# Patient Record
Sex: Female | Born: 1975 | Race: White | Hispanic: No | Marital: Married | Smoking: Never smoker
Health system: Southern US, Community
[De-identification: ages and names within clinical notes are randomized; demographics above are authoritative.]

## PROBLEM LIST (undated history)

## (undated) DIAGNOSIS — R3129 Other microscopic hematuria: Secondary | ICD-10-CM

## (undated) DIAGNOSIS — N979 Female infertility, unspecified: Secondary | ICD-10-CM

## (undated) DIAGNOSIS — R5381 Other malaise: Secondary | ICD-10-CM

## (undated) DIAGNOSIS — E785 Hyperlipidemia, unspecified: Secondary | ICD-10-CM

## (undated) DIAGNOSIS — R5383 Other fatigue: Principal | ICD-10-CM

## (undated) DIAGNOSIS — R790 Abnormal level of blood mineral: Secondary | ICD-10-CM

## (undated) DIAGNOSIS — N200 Calculus of kidney: Secondary | ICD-10-CM

## (undated) DIAGNOSIS — J029 Acute pharyngitis, unspecified: Secondary | ICD-10-CM

## (undated) DIAGNOSIS — T7840XA Allergy, unspecified, initial encounter: Secondary | ICD-10-CM

## (undated) DIAGNOSIS — F419 Anxiety disorder, unspecified: Secondary | ICD-10-CM

## (undated) HISTORY — DX: Anxiety disorder, unspecified: F41.9

## (undated) HISTORY — DX: Hyperlipidemia, unspecified: E78.5

## (undated) HISTORY — DX: Other fatigue: R53.83

## (undated) HISTORY — DX: Calculus of kidney: N20.0

## (undated) HISTORY — DX: Female infertility, unspecified: N97.9

## (undated) HISTORY — DX: Other microscopic hematuria: R31.29

## (undated) HISTORY — DX: Abnormal level of blood mineral: R79.0

## (undated) HISTORY — DX: Other malaise: R53.81

## (undated) HISTORY — DX: Allergy, unspecified, initial encounter: T78.40XA

## (undated) HISTORY — DX: Acute pharyngitis, unspecified: J02.9

---

## 2003-06-16 ENCOUNTER — Emergency Department (HOSPITAL_COMMUNITY): Admission: AD | Admit: 2003-06-16 | Discharge: 2003-06-16 | Payer: Self-pay | Admitting: Family Medicine

## 2003-11-05 ENCOUNTER — Ambulatory Visit (HOSPITAL_COMMUNITY): Admission: RE | Admit: 2003-11-05 | Discharge: 2003-11-05 | Payer: Self-pay

## 2004-01-11 ENCOUNTER — Ambulatory Visit (HOSPITAL_COMMUNITY): Admission: RE | Admit: 2004-01-11 | Discharge: 2004-01-11 | Payer: Self-pay | Admitting: Obstetrics and Gynecology

## 2004-10-01 ENCOUNTER — Emergency Department (HOSPITAL_COMMUNITY): Admission: EM | Admit: 2004-10-01 | Discharge: 2004-10-01 | Payer: Self-pay | Admitting: Family Medicine

## 2004-12-20 ENCOUNTER — Other Ambulatory Visit: Admission: RE | Admit: 2004-12-20 | Discharge: 2004-12-20 | Payer: Self-pay | Admitting: Obstetrics and Gynecology

## 2006-03-20 ENCOUNTER — Other Ambulatory Visit: Admission: RE | Admit: 2006-03-20 | Discharge: 2006-03-20 | Payer: Self-pay | Admitting: Obstetrics and Gynecology

## 2006-11-05 ENCOUNTER — Emergency Department (HOSPITAL_COMMUNITY): Admission: EM | Admit: 2006-11-05 | Discharge: 2006-11-05 | Payer: Self-pay | Admitting: Emergency Medicine

## 2007-02-05 ENCOUNTER — Emergency Department (HOSPITAL_COMMUNITY): Admission: EM | Admit: 2007-02-05 | Discharge: 2007-02-05 | Payer: Self-pay | Admitting: Emergency Medicine

## 2008-03-13 ENCOUNTER — Ambulatory Visit: Payer: Self-pay | Admitting: Internal Medicine

## 2008-03-13 DIAGNOSIS — J309 Allergic rhinitis, unspecified: Secondary | ICD-10-CM | POA: Insufficient documentation

## 2008-03-26 ENCOUNTER — Ambulatory Visit: Payer: Self-pay | Admitting: Internal Medicine

## 2008-03-26 LAB — CONVERTED CEMR LAB
AST: 17 units/L (ref 0–37)
Albumin: 4 g/dL (ref 3.5–5.2)
Alkaline Phosphatase: 52 units/L (ref 39–117)
Bilirubin, Direct: 0.1 mg/dL (ref 0.0–0.3)
Cholesterol: 196 mg/dL (ref 0–200)
Creatinine, Ser: 0.5 mg/dL (ref 0.4–1.2)
GFR calc Af Amer: 182 mL/min
GFR calc non Af Amer: 150 mL/min
Glucose, Bld: 93 mg/dL (ref 70–99)
HCT: 40.6 % (ref 36.0–46.0)
HDL: 64.6 mg/dL (ref >39.0)
LDL Cholesterol: 112 mg/dL — ABNORMAL HIGH (ref 0–99)
MCV: 88.7 fL (ref 78.0–100.0)
Platelets: 279 10*3/uL (ref 150–400)
RDW: 11.5 % (ref 11.5–14.6)
Sodium: 139 meq/L (ref 135–145)
TSH: 1.29 microintl units/mL (ref 0.35–5.50)
Total Bilirubin: 0.8 mg/dL (ref 0.3–1.2)
Total CHOL/HDL Ratio: 3
Total Protein: 7.4 g/dL (ref 6.0–8.3)
Triglycerides: 98 mg/dL (ref 0–149)
VLDL: 20 mg/dL (ref 0–40)
WBC: 8.8 10*3/uL (ref 4.5–10.5)

## 2008-05-01 ENCOUNTER — Ambulatory Visit: Payer: Self-pay | Admitting: *Deleted

## 2008-05-01 DIAGNOSIS — J321 Chronic frontal sinusitis: Secondary | ICD-10-CM | POA: Insufficient documentation

## 2008-09-21 ENCOUNTER — Ambulatory Visit: Payer: Self-pay | Admitting: Internal Medicine

## 2008-09-21 DIAGNOSIS — J069 Acute upper respiratory infection, unspecified: Secondary | ICD-10-CM | POA: Insufficient documentation

## 2008-11-04 ENCOUNTER — Encounter: Payer: Self-pay | Admitting: Internal Medicine

## 2008-12-11 ENCOUNTER — Ambulatory Visit: Payer: Self-pay | Admitting: Family Medicine

## 2008-12-11 DIAGNOSIS — N10 Acute tubulo-interstitial nephritis: Secondary | ICD-10-CM | POA: Insufficient documentation

## 2008-12-11 LAB — CONVERTED CEMR LAB
Bilirubin Urine: NEGATIVE
Glucose, Urine, Semiquant: NEGATIVE
Protein, U semiquant: >=300
WBC Urine, dipstick: NEGATIVE

## 2008-12-12 ENCOUNTER — Encounter: Payer: Self-pay | Admitting: Family Medicine

## 2008-12-23 ENCOUNTER — Ambulatory Visit: Payer: Self-pay | Admitting: Internal Medicine

## 2008-12-23 ENCOUNTER — Ambulatory Visit (HOSPITAL_BASED_OUTPATIENT_CLINIC_OR_DEPARTMENT_OTHER): Admission: RE | Admit: 2008-12-23 | Discharge: 2008-12-23 | Payer: Self-pay | Admitting: Internal Medicine

## 2008-12-23 ENCOUNTER — Ambulatory Visit: Payer: Self-pay | Admitting: Diagnostic Radiology

## 2008-12-23 DIAGNOSIS — R3129 Other microscopic hematuria: Secondary | ICD-10-CM | POA: Insufficient documentation

## 2008-12-23 DIAGNOSIS — R300 Dysuria: Secondary | ICD-10-CM | POA: Insufficient documentation

## 2008-12-23 LAB — CONVERTED CEMR LAB
ALT: 11 units/L (ref 0–35)
AST: 18 units/L (ref 0–37)
Albumin: 4.3 g/dL (ref 3.5–5.2)
BUN: 14 mg/dL (ref 6–23)
Basophils Absolute: 0 10*3/uL (ref 0.0–0.1)
Basophils Relative: 0 % (ref 0–1)
Bilirubin Urine: NEGATIVE
Bilirubin Urine: NEGATIVE
Calcium: 9.3 mg/dL (ref 8.4–10.5)
Creatinine, Ser: 0.69 mg/dL (ref 0.40–1.20)
Eosinophils Absolute: 0.1 10*3/uL (ref 0.0–0.7)
Glucose, Urine, Semiquant: NEGATIVE
HCT: 40.9 % (ref 36.0–46.0)
Hemoglobin: 13.5 g/dL (ref 12.0–15.0)
Ketones, ur: NEGATIVE mg/dL
Leukocytes, UA: NEGATIVE
Monocytes Absolute: 0.6 10*3/uL (ref 0.1–1.0)
Monocytes Relative: 6 % (ref 3–12)
Platelets: 309 10*3/uL (ref 150–400)
Protein, U semiquant: 100
Protein, ur: NEGATIVE mg/dL
RBC: 4.6 M/uL (ref 3.87–5.11)
RDW: 12.4 % (ref 11.5–15.5)
Sed Rate: 10 mm/hr (ref 0–22)
Sodium: 138 meq/L (ref 135–145)
Specific Gravity, Urine: 1.018 (ref 1.005–1.030)
Urine Glucose: NEGATIVE mg/dL
WBC Urine, dipstick: NEGATIVE
WBC: 10.1 10*3/uL (ref 4.0–10.5)
pH: 8.5

## 2008-12-24 ENCOUNTER — Encounter: Payer: Self-pay | Admitting: Internal Medicine

## 2008-12-24 ENCOUNTER — Telehealth: Payer: Self-pay | Admitting: Internal Medicine

## 2009-01-06 ENCOUNTER — Ambulatory Visit: Payer: Self-pay | Admitting: Internal Medicine

## 2009-01-06 LAB — CONVERTED CEMR LAB
Bilirubin Urine: NEGATIVE
Ketones, urine, test strip: NEGATIVE
Nitrite: NEGATIVE
Protein, ur: NEGATIVE mg/dL
Urobilinogen, UA: 0.2
WBC Urine, dipstick: NEGATIVE
WBC, UA: NONE SEEN cells/hpf (ref <3)

## 2009-01-11 ENCOUNTER — Encounter: Payer: Self-pay | Admitting: Internal Medicine

## 2009-01-19 ENCOUNTER — Telehealth: Payer: Self-pay | Admitting: Internal Medicine

## 2009-01-26 ENCOUNTER — Encounter: Payer: Self-pay | Admitting: Internal Medicine

## 2009-02-17 ENCOUNTER — Encounter: Payer: Self-pay | Admitting: Internal Medicine

## 2009-02-24 ENCOUNTER — Ambulatory Visit: Payer: Self-pay | Admitting: Internal Medicine

## 2009-02-24 DIAGNOSIS — K589 Irritable bowel syndrome without diarrhea: Secondary | ICD-10-CM | POA: Insufficient documentation

## 2009-02-24 DIAGNOSIS — Z87442 Personal history of urinary calculi: Secondary | ICD-10-CM | POA: Insufficient documentation

## 2009-03-30 ENCOUNTER — Encounter: Payer: Self-pay | Admitting: Internal Medicine

## 2009-04-13 ENCOUNTER — Ambulatory Visit: Payer: Self-pay | Admitting: Internal Medicine

## 2009-04-13 DIAGNOSIS — R1011 Right upper quadrant pain: Secondary | ICD-10-CM | POA: Insufficient documentation

## 2009-04-13 LAB — CONVERTED CEMR LAB
ALT: 10 units/L (ref 0–35)
Basophils Absolute: 0 10*3/uL (ref 0.0–0.1)
Bilirubin, Direct: 0.1 mg/dL (ref 0.0–0.3)
Eosinophils Absolute: 0.2 10*3/uL (ref 0.0–0.7)
Eosinophils Relative: 2 % (ref 0–5)
Glucose, Urine, Semiquant: NEGATIVE
Hemoglobin: 13.3 g/dL (ref 12.0–15.0)
Ketones, urine, test strip: NEGATIVE
Lymphocytes Relative: 27 % (ref 12–46)
Monocytes Relative: 7 % (ref 3–12)
Neutro Abs: 7.1 10*3/uL (ref 1.7–7.7)
Neutrophils Relative %: 64 % (ref 43–77)
Platelets: 335 10*3/uL (ref 150–400)
Total Protein: 7.7 g/dL (ref 6.0–8.3)
Urobilinogen, UA: 0.2
WBC Urine, dipstick: NEGATIVE
pH: 5

## 2009-04-14 ENCOUNTER — Telehealth: Payer: Self-pay | Admitting: Internal Medicine

## 2009-04-21 ENCOUNTER — Ambulatory Visit (HOSPITAL_BASED_OUTPATIENT_CLINIC_OR_DEPARTMENT_OTHER): Admission: RE | Admit: 2009-04-21 | Discharge: 2009-04-21 | Payer: Self-pay | Admitting: Internal Medicine

## 2009-04-21 ENCOUNTER — Ambulatory Visit: Payer: Self-pay | Admitting: Diagnostic Radiology

## 2009-04-23 ENCOUNTER — Telehealth: Payer: Self-pay | Admitting: Internal Medicine

## 2009-05-27 ENCOUNTER — Encounter: Payer: Self-pay | Admitting: Internal Medicine

## 2009-06-10 ENCOUNTER — Encounter: Admission: RE | Admit: 2009-06-10 | Discharge: 2009-06-10 | Payer: Self-pay | Admitting: Obstetrics and Gynecology

## 2009-08-21 ENCOUNTER — Encounter: Payer: Self-pay | Admitting: Internal Medicine

## 2009-09-03 ENCOUNTER — Ambulatory Visit: Payer: Self-pay | Admitting: Family

## 2009-09-03 DIAGNOSIS — R070 Pain in throat: Secondary | ICD-10-CM | POA: Insufficient documentation

## 2009-09-07 ENCOUNTER — Encounter (INDEPENDENT_AMBULATORY_CARE_PROVIDER_SITE_OTHER): Payer: Self-pay | Admitting: *Deleted

## 2009-09-09 ENCOUNTER — Encounter: Payer: Self-pay | Admitting: Internal Medicine

## 2009-11-08 ENCOUNTER — Ambulatory Visit: Payer: Self-pay | Admitting: Internal Medicine

## 2009-11-08 DIAGNOSIS — R05 Cough: Secondary | ICD-10-CM

## 2009-11-08 DIAGNOSIS — K219 Gastro-esophageal reflux disease without esophagitis: Secondary | ICD-10-CM | POA: Insufficient documentation

## 2009-11-08 DIAGNOSIS — R059 Cough, unspecified: Secondary | ICD-10-CM | POA: Insufficient documentation

## 2010-02-04 ENCOUNTER — Ambulatory Visit: Payer: Self-pay | Admitting: Internal Medicine

## 2010-02-04 DIAGNOSIS — F410 Panic disorder [episodic paroxysmal anxiety] without agoraphobia: Secondary | ICD-10-CM | POA: Insufficient documentation

## 2010-02-04 DIAGNOSIS — R002 Palpitations: Secondary | ICD-10-CM | POA: Insufficient documentation

## 2010-02-04 LAB — CONVERTED CEMR LAB
Free T4: 1.09 ng/dL (ref 0.80–1.80)
TSH: 1.968 microintl units/mL (ref 0.350–4.500)

## 2010-02-07 ENCOUNTER — Encounter: Payer: Self-pay | Admitting: Internal Medicine

## 2010-02-07 LAB — CONVERTED CEMR LAB
5-HIAA, 24 Hr Urine: 3.3 mg/(24.h) (ref <=6.0)
Normetanephrine, 24H Ur: 409 (ref 35–482)

## 2010-02-14 ENCOUNTER — Telehealth: Payer: Self-pay | Admitting: Internal Medicine

## 2010-03-03 ENCOUNTER — Encounter: Payer: Self-pay | Admitting: Internal Medicine

## 2010-06-01 ENCOUNTER — Ambulatory Visit
Admission: RE | Admit: 2010-06-01 | Discharge: 2010-06-01 | Payer: Self-pay | Source: Home / Self Care | Attending: Family | Admitting: Family

## 2010-06-01 ENCOUNTER — Encounter: Payer: Self-pay | Admitting: Family

## 2010-06-01 DIAGNOSIS — K5289 Other specified noninfective gastroenteritis and colitis: Secondary | ICD-10-CM | POA: Insufficient documentation

## 2010-06-01 LAB — CONVERTED CEMR LAB
Alkaline Phosphatase: 55 units/L (ref 39–117)
Bilirubin, Direct: 0.1 mg/dL (ref 0.0–0.3)
Calcium: 9.8 mg/dL (ref 8.4–10.5)
Creatinine, Ser: 0.73 mg/dL (ref 0.40–1.20)
Eosinophils Absolute: 0.1 10*3/uL (ref 0.0–0.7)
Eosinophils Relative: 1 % (ref 0–5)
HCT: 46.9 % — ABNORMAL HIGH (ref 36.0–46.0)
Hemoglobin: 15.4 g/dL — ABNORMAL HIGH (ref 12.0–15.0)
Indirect Bilirubin: 0.2 mg/dL (ref 0.0–0.9)
Monocytes Absolute: 0.7 10*3/uL (ref 0.1–1.0)
Monocytes Relative: 12 % (ref 3–12)
Neutrophils Relative %: 64 % (ref 43–77)
Potassium: 4.6 meq/L (ref 3.5–5.3)
Sodium: 141 meq/L (ref 135–145)
WBC: 6 10*3/uL (ref 4.0–10.5)

## 2010-06-02 ENCOUNTER — Telehealth: Payer: Self-pay | Admitting: Family

## 2010-06-16 ENCOUNTER — Encounter: Payer: Self-pay | Admitting: Internal Medicine

## 2010-06-19 ENCOUNTER — Encounter: Payer: Self-pay | Admitting: Obstetrics and Gynecology

## 2010-06-19 ENCOUNTER — Encounter: Payer: Self-pay | Admitting: Internal Medicine

## 2010-06-28 NOTE — Letter (Signed)
[facility]   Imported By: Lanelle Bal 09/03/2009 09:37:46  _____________________________________________________________________  External Attachment:    Type:   Image     Comment:   External Document

## 2010-06-28 NOTE — Letter (Signed)
Summary: [facility]  [facility]   Imported By: Lanelle Bal 06/14/2009 12:48:32  _____________________________________________________________________  External Attachment:    Type:   Image     Comment:   External Document

## 2010-06-28 NOTE — Assessment & Plan Note (Signed)
Summary: follow up on axiety attacks from past-lb   Vital Signs:  Patient profile:   35 year old female Height:      64 inches Weight:      188.25 pounds BMI:     32.43 Temp:     98.7 degrees F oral Pulse rhythm:   regular Resp:     18 per minute BP sitting:   100 / 80  (right arm) Cuff size:   large  Vitals Entered By: Glendell Docker CMA (February 04, 2010 3:47 PM) CC: Anxiety Is Patient Diabetic? No Pain Assessment Patient in pain? no      Comments discuss anxiety/ panic attacks   Primary Care Provider:  Dondra Spry DO  CC:  Anxiety.  History of Present Illness: 35 y/o white female reports hx of presumed anxiety attacks.  symptoms started 10 -12 yrs ago  last took zoloft 5 yrs ago with seem to help no improvement with exercise  she feels like her throat is closing feels like she is not of control  no symptoms when she busy  occurs when she is quiet  pounding sensation around neck - carotid arteries  recent flare:  no specific trigger.  does not feel stressed   Preventive Screening-Counseling & Management  Alcohol-Tobacco     Smoking Status: never  Allergies (verified): No Known Drug Allergies  Past History:  Past Medical History: Allergic rhinitis  Infertility      Nephrolithiasis, hx of   Microscopic hematuria - negative urologic w/u ([facility] - 2010)   CT of Abd and pelvis - negative for malignancy Hx of Anxiety    Past Surgical History: Denies surgical history          Family History: Htn - Father, mother Hyperlipidemia - father, mother Liver cancer - MGM Bone cancer - PGM Breast cancer - no Colon cancer - no Diabetes - PGF   No fam hx of lupus or other connective tissue disease Father has hx of recurrent kidney stones    Grandfather who lives in [place] is having 100th B Day    Social History: Married for 5 yrs (Husband [place]) Never Smoked Alcohol use-no   Occupation: [employer], 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   Mouth:  pharynx pink and moist and no pharyngeal crowing.   Neck:  No deformities, masses, or tenderness noted. Lungs:  normal respiratory effort and normal breath sounds.   Heart:  normal rate, regular rhythm, and no gallop.     Impression & Recommendations:  Problem # 1:  PALPITATIONS, RECURRENT (ICD-785.1) question anxiety d/o vs other.  rule out pheo, hyperthyoidism, and carcinoid we reviewed deep breathing exercises throat sensation may be related to allergic rxn.  consider allergy referral if recurrent symptoms Orders: T-TSH ([id number]) T-T4, Free [phone]) T- * Misc. Laboratory test [phone]) T- * Misc. Laboratory test [phone]) T- * Misc. Laboratory test [phone])  Complete Medication List: 1)  Omeprazole 20 Mg Cpdr (Omeprazole) .... One by mouth once daily 15 mins before am meal  Other Orders: Influenza Vaccine NON MCR (25956) Flu Vaccine 57yrs + MEDICARE PATIENTS (L8756)  Patient Instructions: 1)  Please schedule a follow-up appointment in 2 weeks.  Current Allergies (reviewed today): No known allergies   Immunizations Administered:  Influenza Vaccine # 1:    Vaccine Type: Fluvax Non-MCR    Site: left deltoid    Mfr: GlaxoSmithKline  Dose: 0.5 ml    Route: IM    Given by: Glendell Docker CMA    Exp. Date: 11/26/2010    Lot #: [id number]    VIS given: 12/21/09 version given February 04, 2010.  Flu Vaccine Consent Questions:    Do you have a history of severe allergic reactions to this vaccine? no    Any prior history of allergic reactions to egg and/or gelatin? no    Do you have a sensitivity to the preservative Thimersol? no    Do you have a past history of Guillan-Barre Syndrome? no    Do you currently have an acute febrile illness? no    Have you ever had a severe reaction to latex? no    Vaccine information given and explained to patient? yes    Are you currently  pregnant? no

## 2010-06-28 NOTE — Assessment & Plan Note (Signed)
Summary: FEELS LIKE SOMETHING STUCK IN THROAT   Vital Signs:  Patient profile:   35 year old female Height:      64 inches Weight:      190.75 pounds BMI:     32.86 Temp:     98.5 degrees F oral Pulse rate:   84 / minute Pulse rhythm:   regular Resp:     16 per minute BP sitting:   126 / 82  (right arm) Cuff size:   large  Vitals Entered By: Mervin Kung CMA (September 03, 2009 3:14 PM) CC: room 5    Pt states she feels like there is something in her throat on the left side x 2 weeks. Has a pinching sensation in her throat when she turns her head.  Completed antibiotic 1 week ago for sinus infection.   Primary Care Provider:  Dondra Spry DO  CC:  room 5    Pt states she feels like there is something in her throat on the left side x 2 weeks. Has a pinching sensation in her throat when she turns her head.  Completed antibiotic 1 week ago for sinus infection.Marland Kitchen  History of Present Illness: Ms Cegielski is a 35 year old female who presents with complaint of sensation of throat discomfort which is noted in the back of her throat x 2 weeks. Thes followed a recent sinus infection which was [facility].  Notes that her sinus symptoms have improved.  Denies associated fever, denies cough.  Denies pain with swallowing.   Allergies (verified): No Known Drug Allergies  Physical Exam  General:  Well-developed,well-nourished,in no acute distress; alert,appropriate and cooperative throughout examination Head:  Normocephalic and atraumatic without obvious abnormalities. No apparent alopecia or balding. Ears:  External ear exam shows no significant lesions or deformities.  Otoscopic examination reveals clear canals, tympanic membranes are intact bilaterally without bulging, retraction, inflammation or discharge. Hearing is grossly normal bilaterally. Mouth:  Oral mucosa and oropharynx without lesions or exudates.  Teeth in good repair. Neck:  No deformities, masses, or tenderness  noted.   Impression & Recommendations:  Problem # 1:  THROAT PAIN (ICD-784.1) Assessment New 2 week history of left sided throat fullness, no palpable thyroid enlargement or lymphadenopthy.  Will refer to ENT for further evaluation.   Orders: ENT Referral (ENT)  Complete Medication List: 1)  Fluticasone Propionate 50 Mcg/act Susp (Fluticasone propionate) .... 2 sprays each nostril qd 2)  Sm Sinus & Allergy Max St 4-60 Mg Tabs (Chlorpheniramine-pseudoeph) .Marland Kitchen.. 1 tablet every four hours as needed.  Patient Instructions: 1)  You will be contacted about your referral to ENT.  Current Allergies (reviewed today): No known allergies

## 2010-06-28 NOTE — Letter (Signed)
Summary: Primary Care Consult Scheduled Letter  [place] [facility]  [address]   [place], [place] [address]   Phone: [phone]  Fax: [phone]      09/07/2009 MRN: [id number]  TIMERA WINDT [address] [place], [place]  [address]    Dear Ms. Fenton Malling,      We have scheduled an appointment for you.  At the recommendation of MELISSA O'SULLIVAN, FNP , we have scheduled you a consult with [facility], DR Jearld Fenton on APRIL 25TH   at 3:10PM .  Their address is_[address], [place] . The office phone number is [phone] .  If this appointment day and time is not convenient for you, please feel free to call the office of the doctor you are being referred to at the number listed above and reschedule the appointment.     It is important for you to keep your scheduled appointments. We are here to make sure you are given good patient care.     Thank you, Darral Dash Patient Care Coordinator [place] [facility]

## 2010-06-28 NOTE — Progress Notes (Signed)
  Phone Note Outgoing Call   Summary of Call: LMOVM - pt advised to call office re:  lab / urine test results Initial call taken by: D. Thomos Lemons DO,  February 14, 2010 12:05 PM  Follow-up for Phone Call        Pt called back, she will be at her home # the rest of the afternoon Diane Tomerlin  February 14, 2010 3:56 PM  Additional Follow-up for Phone Call Additional follow up Details #1::        pt notified of test results pt states panic attacks less freq she does not want to start any meds that are potentially unsafe during preg pt will think about behavioral health consult pt advised to avoid all caffeinated beverages and use deep breathing exercises  Additional Follow-up by: D. Thomos Lemons DO,  February 14, 2010 5:57 PM

## 2010-06-28 NOTE — Consult Note (Signed)
Summary: [facility]  [facility]   Imported By: Lanelle Bal 03/10/2010 12:07:22  _____________________________________________________________________  External Attachment:    Type:   Image     Comment:   External Document

## 2010-06-28 NOTE — Assessment & Plan Note (Signed)
Summary: CHEST CONGESTION BODY ACHES/DT--Rm 2   Vital Signs:  Patient profile:   35 year old female Height:      64 inches Weight:      188.75 pounds BMI:     32.52 O2 Sat:      100 % on Room air Temp:     98.4 degrees F oral Pulse rate:   90 / minute Pulse rhythm:   regular Resp:     16 per minute BP sitting:   110 / 80  (right arm) Cuff size:   large  Vitals Entered By: Mervin Kung CMA (November 08, 2009 4:27 PM)  O2 Flow:  Room air CC: , , Cough Is Patient Diabetic? No Comments Pt has completed Sinus and Allergy tabs.   Primary Care Provider:  Dondra Spry DO  CC:  , , and Cough.  History of Present Illness:  Cough      This is a 35 year old woman who presents with Cough.  The patient reports non-productive cough, but denies shortness of breath, wheezing, and fever.  Associated symtpoms include acid reflux symptoms.  The patient denies the following symptoms: cold/URI symptoms, sore throat, and nasal congestion.  The cough is worse with lying down.    Allergies (verified): No Known Drug Allergies  Past History:  Past Medical History: Allergic rhinitis  Infertility      Nephrolithiasis, hx of  Microscopic hematuria - negative urologic w/u ([facility] - 2010)   CT of Abd and pelvis - negative for malignancy   PMH-FH-SH reviewed for relevance  Family History: Htn - Father, mother Hyperlipidemia - father, mother Liver cancer - MGM Bone cancer - PGM Breast cancer - no Colon cancer - no Diabetes - PGF   No fam hx of lupus or other connective tissue disease Father has hx of recurrent kidney stones    Grandfather who lives in [place] is having 100th B Day  Social History: Married for 5 yrs (Husband [place]) Never Smoked Alcohol use-no  Occupation: [employer], 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   Eyes:  pupils equal, pupils round, and pupils reactive to light.   Ears:  R ear  normal and L ear normal.   Mouth:  pharynx pink and moist.   Lungs:  normal respiratory effort and normal breath sounds.   Heart:  normal rate, regular rhythm, and no gallop.     Impression & Recommendations:  Problem # 1:  COUGH (ICD-786.2) Pt with lower throat irritation triggering cough.  her symptoms got worse after stopping PPI.  ENT started PPI for abnormal throat sensation cough likely related to reflux vs atypical bronchitis restart PPI.  use cough suppressant We discussed reg flag symptoms.  She is traveling to [place].   Rx for azithro provided in case symptoms get worse.  Problem # 2:  GERD (ICD-530.81) she is already following anti reflux measures. Use PPI as directed.  If she wants to stop, we discussed transitioning to zantac  Her updated medication list for this problem includes:    Omeprazole 20 Mg Cpdr (Omeprazole) ..... One by mouth once daily 15 mins before am meal  Complete Medication List: 1)  Fluticasone Propionate 50 Mcg/act Susp (Fluticasone propionate) .... 2 sprays each nostril qd 2)  Sm Sinus & Allergy Max St 4-60 Mg Tabs (Chlorpheniramine-pseudoeph) .Marland Kitchen.. 1 tablet every four hours as needed. 3)  Hydrocodone-homatropine 5-1.5 Mg/68ml Syrp (Hydrocodone-homatropine) .... 5  ml by mouth two times a day as needed 4)  Azithromycin 250 Mg Tabs (Azithromycin) .... 2 tabs on day one, then one by mouth once daily 5)  Omeprazole 20 Mg Cpdr (Omeprazole) .... One by mouth once daily 15 mins before am meal  Patient Instructions: 1)  Call our office if your symptoms do not  improve or gets worse. Prescriptions: OMEPRAZOLE 20 MG CPDR (OMEPRAZOLE) one by mouth once daily 15 mins before AM meal  #90 x 1   Entered and Authorized by:   D. Thomos Lemons DO   Signed by:   D. Thomos Lemons DO on 11/08/2009   Method used:   [facility]        [facility] [phone][facility] (retail)       [address]       [place], [place]  [address]       Ph: [phone]        Fax: [phone]   RxID:   [id number] AZITHROMYCIN 250 MG TABS (AZITHROMYCIN) 2 tabs on day one, then one by mouth once daily  #6 x 0   Entered and Authorized by:   D. Thomos Lemons DO   Signed by:   D. Thomos Lemons DO on 11/08/2009   Method used:   Print then Give to Patient   RxID:   [id number] HYDROCODONE-HOMATROPINE 5-1.5 MG/5ML SYRP (HYDROCODONE-HOMATROPINE) 5 ml by mouth two times a day as needed  #60 x 0   Entered and Authorized by:   D. Thomos Lemons DO   Signed by:   D. Thomos Lemons DO on 11/08/2009   Method used:   Print then Give to Patient   RxID:   [id number]   Current Allergies (reviewed today): No known allergies

## 2010-06-28 NOTE — Consult Note (Signed)
Summary: [facility]  [facility]   Imported By: Lanelle Bal 09/16/2009 09:29:03  _____________________________________________________________________  External Attachment:    Type:   Image     Comment:   External Document

## 2010-06-30 NOTE — Letter (Signed)
Summary: Out of Work  Adult nurse at [employer]. [address]   [place], [place] [address]   Phone: [phone]  Fax: [phone]    June 01, 2010   Employee:  MEAGHAN WHISTLER    To Whom It May Concern:   For Medical reasons, please excuse the above named employee from work for the following dates:  Start:   05/31/10  End:   May return to work on 06/06/10  If you need additional information, please feel free to contact our office.         Sincerely,    Lemont Fillers FNP

## 2010-06-30 NOTE — Assessment & Plan Note (Signed)
Summary: fever vomiting/mhf--rm 5   Vital Signs:  Patient profile:   35 year old female Height:      64 inches Weight:      177 pounds BMI:     30.49 Temp:     97.8 degrees F oral Pulse rate:   78 / minute Pulse rhythm:   regular Resp:     16 per minute BP sitting:   122 / 86  (right arm) Cuff size:   large  Vitals Entered By: Stephanie Stout) (June 01, 2010 10:31 AM) CC: Pt states she has had diarrhea, vomiting and fever since Sunday. Feels like her pulse is racing., Headache Is Patient Diabetic? No Pain Assessment Patient in pain? no      Comments Pt agrees all med doses and directions are correct. Stephanie Stout)  June 01, 2010 10:35 AM    Primary Care Provider:  Dondra Spry DO  CC:  Pt states she has had diarrhea, vomiting and fever since Sunday. Feels like her pulse is racing., and Headache.  History of Present Illness: Stephanie Stout is a 35 year old female who presents today with chief complaint of diarrhea.  1) Diarrhea-  Symptoms started sunday night with vomitting.  Fever rose to 101 that day.  Diarrhea started Sunday night as well.  Denies sick contacts.  Feels weak and tired.  Feels hot and sweaty.  Trying to drink gatorade and water.  But if drinks too much vomits. Ate some cream of wheat this AM which she has kept down.  Denies stomach pain/cramping.  Now having diarrhea "every 30 minutes- just like water." This is not consistent with her typical symptoms of IBS.     Allergies (verified): 1)  ! Levaquin  Past History:  Past Medical History: Last updated: 02/04/2010 Allergic rhinitis  Infertility      Nephrolithiasis, hx of   Microscopic hematuria - negative urologic w/u ([facility] - 2010)   CT of Abd and pelvis - negative for malignancy Hx of Anxiety    Past Surgical History: Last updated: 02/04/2010 Denies surgical history          Review of Systems       see HPI  Physical Exam  General:   Well-developed,well-nourished,in no acute distress; alert,appropriate and cooperative throughout examination Head:  Normocephalic and atraumatic without obvious abnormalities. No apparent alopecia or balding. Lungs:  Normal respiratory effort, chest expands symmetrically. Lungs are clear to auscultation, no crackles or wheezes. Heart:  Normal rate and regular rhythm. S1 and S2 normal without gallop, murmur, click, rub or other extra sounds. Abdomen:  Bowel sounds positive,abdomen soft and non-tender without masses, organomegaly or hernias noted.   Impression & Recommendations:  Problem # 1:  GASTROENTERITIS, ACUTE (ICD-558.9) Assessment New Will plan to treat with zofran and lomotil.  Recommended continued hydration efforts- gatorade, popsicles, sorbet etc.  Pt instructed to call for f/u if symptoms worsen, or if no improvement in 48-72 hours.  Check labs as below. Her updated medication list for this problem includes:    Zofran 4 Mg Tabs (Ondansetron hcl) ..... One tablet by mouth every 8 hours as needed for nausea  Orders: TLB-CBC Platelet - w/Differential (85025-CBCD) TLB-Hepatic/Liver Function Pnl (80076-HEPATIC) TLB-BMP (Basic Metabolic Panel-BMET) (80048-METABOL)  Complete Medication List: 1)  Omeprazole 20 Mg Cpdr (Omeprazole) .... One by mouth once daily 15 mins before am meal 2)  Zofran 4 Mg Tabs (Ondansetron hcl) .... One tablet by mouth every  8 hours as needed for nausea 3)  Lomotil 2.5-0.025 Mg Tabs (Diphenoxylate-atropine) .... One tablet by mouth 4 times daily as needed for diarrrhea  Patient Instructions: 1)  Please drink small frequent amounts of fluid. 2)  Call if you develop fever, abdominal pain, if symptoms worsen, or if they are not improved in 48-72 hours. Prescriptions: LOMOTIL 2.5-0.025 MG TABS (DIPHENOXYLATE-ATROPINE) one tablet by mouth 4 times daily as needed for diarrrhea  #20 x 0   Entered and Authorized by:   Lemont Fillers FNP   Signed by:   Lemont Fillers FNP on 06/01/2010   Method used:   Print then Give to Patient   RxID:   [id number] ZOFRAN 4 MG TABS (ONDANSETRON HCL) one tablet by mouth every 8 hours as needed for nausea  #20 x 0   Entered and Authorized by:   Lemont Fillers FNP   Signed by:   Lemont Fillers FNP on 06/01/2010   Method used:   [facility] to        [facility] [phone]* (retail)       [address]       [place], [place]  [address]       Ph: [phone]       Fax: [phone]   RxID:   [id number]    Orders Added: 1)  TLB-CBC Platelet - w/Differential [85025-CBCD] 2)  TLB-Hepatic/Liver Function Pnl [80076-HEPATIC] 3)  TLB-BMP (Basic Metabolic Panel-BMET) [80048-METABOL] 4)  Est. Patient Level III [34742]    Current Allergies (reviewed today): ! LEVAQUIN

## 2010-06-30 NOTE — Progress Notes (Signed)
Summary: lab result, status  Phone Note Outgoing Call   Summary of Call: Please call patient and let her know that her labs look good.  Please ask her how she is feeling. Initial call taken by: Lemont Fillers FNP,  June 02, 2010 8:33 PM  Follow-up for Phone Call        Pt notified. States diarrhea stopped on Wednesday and nausea has subsided; feeling much better. Nicki Guadalajara Fergerson CMA Duncan Dull)  June 03, 2010 8:43 AM

## 2010-07-14 NOTE — Consult Note (Signed)
Summary: [facility]  [facility]   Imported By: Lanelle Bal 07/06/2010 08:00:50  _____________________________________________________________________  External Attachment:    Type:   Image     Comment:   External Document

## 2010-07-29 ENCOUNTER — Encounter: Payer: Self-pay | Admitting: Internal Medicine

## 2010-08-16 NOTE — Letter (Signed)
Summary: Physician's Report on Prospective Adoptive Parent  Physician's Report on Prospective Adoptive Parent   Imported By: Maryln Gottron 08/08/2010 10:55:38  _____________________________________________________________________  External Attachment:    Type:   Image     Comment:   External Document

## 2010-08-29 ENCOUNTER — Encounter: Payer: Self-pay | Admitting: Internal Medicine

## 2010-12-13 ENCOUNTER — Other Ambulatory Visit: Payer: Self-pay | Admitting: Gastroenterology

## 2010-12-13 DIAGNOSIS — R1032 Left lower quadrant pain: Secondary | ICD-10-CM

## 2010-12-15 ENCOUNTER — Ambulatory Visit
Admission: RE | Admit: 2010-12-15 | Discharge: 2010-12-15 | Disposition: A | Discharge status: Home / Self Care | Payer: BC Managed Care – PPO | Source: Ambulatory Visit | Attending: Gastroenterology | Admitting: Gastroenterology

## 2010-12-15 DIAGNOSIS — R1032 Left lower quadrant pain: Secondary | ICD-10-CM

## 2010-12-15 MED ORDER — IOHEXOL 300 MG/ML  SOLN: 100.0000 mL | Freq: Once | INTRAMUSCULAR | Status: AC | PRN

## 2011-02-16 ENCOUNTER — Ambulatory Visit (INDEPENDENT_AMBULATORY_CARE_PROVIDER_SITE_OTHER): Payer: BC Managed Care – PPO | Admitting: Internal Medicine

## 2011-02-16 ENCOUNTER — Encounter: Payer: Self-pay | Admitting: Internal Medicine

## 2011-02-16 VITALS — BP 122/84 | HR 80 | Temp 98.6°F | Ht 64.0 in | Wt 184.0 lb

## 2011-02-16 DIAGNOSIS — Z23 Encounter for immunization: Secondary | ICD-10-CM

## 2011-02-16 DIAGNOSIS — F410 Panic disorder [episodic paroxysmal anxiety] without agoraphobia: Secondary | ICD-10-CM

## 2011-02-16 DIAGNOSIS — K219 Gastro-esophageal reflux disease without esophagitis: Secondary | ICD-10-CM

## 2011-02-16 MED ORDER — ALPRAZOLAM 0.25 MG PO TABS: 0.2500 mg | ORAL_TABLET | Freq: Three times a day (TID) | ORAL | Status: DC | PRN

## 2011-02-16 MED ORDER — OMEPRAZOLE 40 MG PO CPDR: 40.0000 mg | DELAYED_RELEASE_CAPSULE | Freq: Every day | ORAL | Status: DC

## 2011-02-16 MED ORDER — ALPRAZOLAM 0.25 MG PO TABS: 0.2500 mg | ORAL_TABLET | Freq: Three times a day (TID) | ORAL | Status: AC | PRN

## 2011-02-16 MED ORDER — SERTRALINE HCL 25 MG PO TABS: ORAL_TABLET | ORAL | Status: DC

## 2011-02-16 NOTE — Patient Instructions (Signed)
Call our office in 1 month with response to sertraline.

## 2011-02-16 NOTE — Progress Notes (Signed)
  Subjective:    Patient ID: Stephanie Stout, female    DOB: [birth year], 35 y.o.   MRN: [id number]  HPI  35 y/o white female with hx anxiety for follow up.  She has tried relaxation techniques w/o improvement.  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  The stress is aggravating her GERD.  She gets nauseated when she is stressed.   She also feels heart thumping in her neck.  She has tried zoloft in the past.  She took for 4 yrs then stopped.  She does not recall any adverse rxn.   Review of Systems Mild wt loss.  No dysphagia   Past Medical History  Diagnosis Date  . Allergy   . Infertility, female   . Nephrolithiasis   . Hematuria, microscopic     negative urologic w/u, CT of abd and pelvis was negative for malignancy  . Anxiety     History   Social History  . Marital Status: Married    Spouse Name: N/A    Number of Children: N/A  . Years of Education: N/A   Occupational History  . Not on file.   Social History Main Topics  . Smoking status: Never Smoker   . Smokeless tobacco: Not on file  . Alcohol Use: No  . Drug Use: No  . Sexually Active:    Other Topics Concern  . Not on file   Social History Narrative  . No narrative on file    No past surgical history on file.  Family History  Problem Relation Age of Onset  . Hypertension Mother   . Hyperlipidemia Mother   . Hypertension Father   . Hyperlipidemia Father   . Nephrolithiasis Father   . Cancer Maternal Grandmother     liver  . Cancer Paternal Grandmother     bone  . Diabetes Paternal Grandfather     Allergies  Allergen Reactions  . Levofloxacin     REACTION: panic attacks, stomach upset    No current outpatient prescriptions on file prior to visit.    BP 122/84  Pulse 80  Temp(Src) 98.6 F (37 C) (Oral)  Ht 5\' 4"  (1.626 m)  Wt 184 lb (83.462 kg)  BMI 31.58 kg/m2       Objective:   Physical Exam   Constitutional: Appears well-developed and well-nourished. No  distress.  Neck: Normal range of motion. Neck supple. No thyromegaly present. No carotid bruit Cardiovascular: Normal rate, regular rhythm and normal heart sounds.  Exam reveals no gallop and no friction rub.   No murmur heard. Pulmonary/Chest: Effort normal and breath sounds normal.  No wheezes. No rales.  Abdominal: Soft. Bowel sounds are normal. No mass. There is no tenderness.  Neurological: Alert. No cranial nerve deficit.  Skin: Skin is warm and dry.  Psychiatric: Normal mood and affect. Behavior is normal.      Assessment & Plan:

## 2011-02-16 NOTE — Assessment & Plan Note (Signed)
Has last EGD at [facility] was normal.  07/2010 Use omeprazole 40 mg for now.  We discussed transitioning to zantac when her symptoms are better.

## 2011-02-16 NOTE — Assessment & Plan Note (Signed)
Start sertraline.  Use alprazolam as needed. Common side effects discussed. Patient advised to call office if symptoms persist or worsen.

## 2011-03-16 LAB — WET PREP, GENITAL
Trich, Wet Prep: NONE SEEN
Yeast Wet Prep HPF POC: NONE SEEN

## 2011-03-16 LAB — URINALYSIS, ROUTINE W REFLEX MICROSCOPIC
Bilirubin Urine: NEGATIVE
Glucose, UA: NEGATIVE
Hgb urine dipstick: NEGATIVE
Ketones, ur: NEGATIVE
Nitrite: NEGATIVE
Protein, ur: NEGATIVE
Specific Gravity, Urine: 1.006
Urobilinogen, UA: 0.2
pH: 6.5

## 2011-03-16 LAB — GC/CHLAMYDIA PROBE AMP, GENITAL
Chlamydia, DNA Probe: NEGATIVE
GC Probe Amp, Genital: NEGATIVE

## 2011-03-16 LAB — PREGNANCY, URINE: Preg Test, Ur: NEGATIVE

## 2011-03-16 LAB — URINE CULTURE: Colony Count: 4000

## 2011-04-04 ENCOUNTER — Telehealth: Payer: Self-pay | Admitting: *Deleted

## 2011-04-04 NOTE — Telephone Encounter (Signed)
Pt is taking Zoloft 25 mg and she said Dr Artist Pais wanted her to call in 1 mth to let him know how she was doing.  She feeling fine and would like a refill sent [facility]

## 2011-04-05 MED ORDER — SERTRALINE HCL 25 MG PO TABS: ORAL_TABLET | ORAL | Status: DC

## 2011-04-05 NOTE — Telephone Encounter (Signed)
Ok to send 3 month supply with 1 refill

## 2011-04-05 NOTE — Telephone Encounter (Signed)
rx sent in electronically 

## 2011-08-28 ENCOUNTER — Telehealth: Payer: Self-pay | Admitting: Internal Medicine

## 2011-08-28 MED ORDER — SERTRALINE HCL 25 MG PO TABS: 25.0000 mg | ORAL_TABLET | Freq: Every day | ORAL | Status: DC

## 2011-08-28 NOTE — Telephone Encounter (Signed)
rx sent in electronically 

## 2011-08-28 NOTE — Telephone Encounter (Signed)
Refill- sertraline  hcl 25mg  tablet. Take 1/2 tablet every morning for 7 days then 1 tablet every day. Qty 30 last fill 2.22.13

## 2011-08-29 ENCOUNTER — Other Ambulatory Visit: Payer: Self-pay | Admitting: Internal Medicine

## 2011-08-31 ENCOUNTER — Other Ambulatory Visit: Payer: Self-pay | Admitting: Internal Medicine

## 2011-12-25 ENCOUNTER — Telehealth: Payer: Self-pay | Admitting: Internal Medicine

## 2011-12-25 MED ORDER — SERTRALINE HCL 25 MG PO TABS: 25.0000 mg | ORAL_TABLET | Freq: Every day | ORAL | Status: DC

## 2011-12-25 NOTE — Telephone Encounter (Signed)
1 month supply of zoloft sent in electronically to [facility], pt will need OV

## 2012-02-01 ENCOUNTER — Encounter: Payer: Self-pay | Admitting: Internal Medicine

## 2012-02-01 ENCOUNTER — Ambulatory Visit (INDEPENDENT_AMBULATORY_CARE_PROVIDER_SITE_OTHER): Payer: BC Managed Care – PPO | Admitting: Internal Medicine

## 2012-02-01 VITALS — BP 104/76 | HR 78 | Temp 97.2°F | Ht 64.0 in | Wt 176.0 lb

## 2012-02-01 DIAGNOSIS — J029 Acute pharyngitis, unspecified: Secondary | ICD-10-CM | POA: Insufficient documentation

## 2012-02-01 LAB — POCT RAPID STREP A (OFFICE): Rapid Strep A Screen: NEGATIVE

## 2012-02-01 MED ORDER — SERTRALINE HCL 25 MG PO TABS: 25.0000 mg | ORAL_TABLET | Freq: Every day | ORAL | Status: DC

## 2012-02-01 NOTE — Patient Instructions (Addendum)
Gargle with warm salt water. Please call our office if your symptoms do not improve or gets worse.

## 2012-02-01 NOTE — Assessment & Plan Note (Addendum)
36 year old white female with acute pharyngitis. She has mild oropharyngeal erythema. She has no fever. Minimal neck tenderness. Likely viral etiology. Symptomatic treatment. Patient advised to call office if symptoms persist or worsen.  Rapid strep is negative.

## 2012-02-01 NOTE — Progress Notes (Signed)
  Subjective:    Patient ID: KANITA DELAGE, female    DOB: 01-04-1976, 36 y.o.   MRN: [id number]  Sore Throat  This is a new problem. The current episode started in the past 7 days. The problem has been gradually worsening. There has been no fever. The pain is moderate. Associated symptoms include headaches and swollen glands. Pertinent negatives include no coughing, plugged ear sensation or trouble swallowing. She has tried NSAIDs for the symptoms. The treatment provided mild relief.    Her one year daughter is also sick.  Review of Systems  HENT: Negative for trouble swallowing.   Respiratory: Negative for cough.   Neurological: Positive for headaches.   Past Medical History  Diagnosis Date  . Allergy   . Infertility, female   . Nephrolithiasis   . Hematuria, microscopic     negative urologic w/u, CT of abd and pelvis was negative for malignancy  . Anxiety     History   Social History  . Marital Status: Married    Spouse Name: N/A    Number of Children: N/A  . Years of Education: N/A   Occupational History  . Not on file.   Social History Main Topics  . Smoking status: Never Smoker   . Smokeless tobacco: Not on file  . Alcohol Use: No  . Drug Use: No  . Sexually Active:    Other Topics Concern  . Not on file   Social History Narrative  . No narrative on file    No past surgical history on file.  Family History  Problem Relation Age of Onset  . Hypertension Mother   . Hyperlipidemia Mother   . Hypertension Father   . Hyperlipidemia Father   . Nephrolithiasis Father   . Cancer Maternal Grandmother     liver  . Cancer Paternal Grandmother     bone  . Diabetes Paternal Grandfather     Allergies  Allergen Reactions  . Levofloxacin     REACTION: panic attacks, stomach upset    Current Outpatient Prescriptions on File Prior to Visit  Medication Sig Dispense Refill  . sertraline (ZOLOFT) 25 MG tablet Take 1 tablet (25 mg total) by mouth daily. Needs  OV  30 tablet  0    BP 104/76  Pulse 78  Temp 97.2 F (36.2 C) (Oral)  Ht 5\' 4"  (1.626 m)  Wt 176 lb (79.833 kg)  BMI 30.21 kg/m2       Objective:   Physical Exam  Constitutional: She appears well-developed and well-nourished.  HENT:  Head: Normocephalic and atraumatic.  Left Ear: External ear normal.       Right tympanic membrane slightly retracted  Eyes: Conjunctivae are normal. Pupils are equal, round, and reactive to light.  Neck: Neck supple.       Mild neck tenderness  Cardiovascular: Normal rate, regular rhythm and normal heart sounds.   Pulmonary/Chest: Effort normal and breath sounds normal. She has no wheezes.  Lymphadenopathy:    She has no cervical adenopathy.       Assessment & Plan:

## 2012-03-12 ENCOUNTER — Ambulatory Visit (INDEPENDENT_AMBULATORY_CARE_PROVIDER_SITE_OTHER): Payer: BC Managed Care – PPO | Admitting: Internal Medicine

## 2012-03-12 ENCOUNTER — Encounter: Payer: Self-pay | Admitting: Internal Medicine

## 2012-03-12 ENCOUNTER — Ambulatory Visit (INDEPENDENT_AMBULATORY_CARE_PROVIDER_SITE_OTHER): Payer: BC Managed Care – PPO | Admitting: Family

## 2012-03-12 VITALS — BP 112/80 | Temp 99.3°F | Wt 169.0 lb

## 2012-03-12 DIAGNOSIS — H6590 Unspecified nonsuppurative otitis media, unspecified ear: Secondary | ICD-10-CM

## 2012-03-12 MED ORDER — CEFUROXIME AXETIL 500 MG PO TABS: 500.0000 mg | ORAL_TABLET | Freq: Two times a day (BID) | ORAL | Status: AC

## 2012-03-12 NOTE — Assessment & Plan Note (Signed)
36 year old white female with signs and symptoms of possible left serous otitis media. Treat with cefuroxime 500 mg twice daily. Also use Mucinex over-the-counter twice daily along with intranasal saline spray.  Patient advised to call office if symptoms persist or worsen.

## 2012-03-12 NOTE — Patient Instructions (Addendum)
Continue to use over the counter intranasal saline.  Use mucinex every 12 hours. Please call our office if your symptoms do not improve or gets worse.

## 2012-03-12 NOTE — Progress Notes (Signed)
  Subjective:    Patient ID: Stephanie Stout, female    DOB: 05-11-1976, 36 y.o.   MRN: [id number]  HPI  36 year old white female complains of left ear pain and nasal congestion for greater than one week. Her symptoms initially started with runny nose and sneezing. She then progressed to severe nasal and sinus congestion. Patient reports she has decreased hearing in her left ear. She has significant discomfort and "clogged" sensation in her left ear.  She had fever over the weekend as well as nausea and vomiting. Those symptoms have since resolved.   Review of Systems Negative for cough or shortness of breath  Past Medical History  Diagnosis Date  . Allergy   . Infertility, female   . Nephrolithiasis   . Hematuria, microscopic     negative urologic w/u, CT of abd and pelvis was negative for malignancy  . Anxiety     History   Social History  . Marital Status: Married    Spouse Name: N/A    Number of Children: N/A  . Years of Education: N/A   Occupational History  . Not on file.   Social History Main Topics  . Smoking status: Never Smoker   . Smokeless tobacco: Not on file  . Alcohol Use: No  . Drug Use: No  . Sexually Active:    Other Topics Concern  . Not on file   Social History Narrative  . No narrative on file    No past surgical history on file.  Family History  Problem Relation Age of Onset  . Hypertension Mother   . Hyperlipidemia Mother   . Hypertension Father   . Hyperlipidemia Father   . Nephrolithiasis Father   . Cancer Maternal Grandmother     liver  . Cancer Paternal Grandmother     bone  . Diabetes Paternal Grandfather     Allergies  Allergen Reactions  . Levofloxacin     REACTION: panic attacks, stomach upset    Current Outpatient Prescriptions on File Prior to Visit  Medication Sig Dispense Refill  . sertraline (ZOLOFT) 25 MG tablet Take 1 tablet (25 mg total) by mouth daily. Needs OV  90 tablet  3    BP 112/80  Temp 99.3 F  (37.4 C)  Wt 169 lb (76.658 kg)       Objective:   Physical Exam  Constitutional: She appears well-developed and well-nourished. No distress.  HENT:  Head: Normocephalic and atraumatic.       Left tympanic membrane is retracted and erythematous Decreased hearing left ear (finger rub)  Neck: Neck supple.       No neck tenderness  Cardiovascular: Normal rate, regular rhythm and normal heart sounds.   No murmur heard. Pulmonary/Chest: Effort normal and breath sounds normal. She has no wheezes.  Lymphadenopathy:    She has no cervical adenopathy.  Skin: Skin is warm and dry.          Assessment & Plan:

## 2012-03-15 ENCOUNTER — Encounter: Payer: Self-pay | Admitting: Internal Medicine

## 2012-04-15 ENCOUNTER — Ambulatory Visit (INDEPENDENT_AMBULATORY_CARE_PROVIDER_SITE_OTHER): Payer: BC Managed Care – PPO | Admitting: Internal Medicine

## 2012-04-15 ENCOUNTER — Encounter: Payer: Self-pay | Admitting: Internal Medicine

## 2012-04-15 VITALS — BP 114/76 | Temp 99.9°F | Wt 166.0 lb

## 2012-04-15 DIAGNOSIS — J189 Pneumonia, unspecified organism: Secondary | ICD-10-CM | POA: Insufficient documentation

## 2012-04-15 DIAGNOSIS — J069 Acute upper respiratory infection, unspecified: Secondary | ICD-10-CM

## 2012-04-15 MED ORDER — TRAMADOL HCL 50 MG PO TABS: 50.0000 mg | ORAL_TABLET | Freq: Two times a day (BID) | ORAL | Status: DC | PRN

## 2012-04-15 NOTE — Progress Notes (Signed)
  Subjective:    Patient ID: Stephanie Stout, female    DOB: 1975-12-28, 36 y.o.   MRN: [id number]  HPI  36 year old white female complains of nonproductive cough, generalized achiness and low-grade fever since Friday of last week. Patient has been taking Tylenol or Advil every 6-8 hours for low-grade fever and generalized achiness. Her infant daughter has similar type illness. She denies any shortness of breath. She denies any nasal congestion. She has mild sore throat.  Review of Systems No chills, no nausea vomiting or diarrhea  Past Medical History  Diagnosis Date  . Allergy   . Infertility, female   . Nephrolithiasis   . Hematuria, microscopic     negative urologic w/u, CT of abd and pelvis was negative for malignancy  . Anxiety     History   Social History  . Marital Status: Married    Spouse Name: N/A    Number of Children: N/A  . Years of Education: N/A   Occupational History  . Not on file.   Social History Main Topics  . Smoking status: Never Smoker   . Smokeless tobacco: Not on file  . Alcohol Use: No  . Drug Use: No  . Sexually Active:    Other Topics Concern  . Not on file   Social History Narrative  . No narrative on file    No past surgical history on file.  Family History  Problem Relation Age of Onset  . Hypertension Mother   . Hyperlipidemia Mother   . Hypertension Father   . Hyperlipidemia Father   . Nephrolithiasis Father   . Cancer Maternal Grandmother     liver  . Cancer Paternal Grandmother     bone  . Diabetes Paternal Grandfather     Allergies  Allergen Reactions  . Levofloxacin     REACTION: panic attacks, stomach upset  . Augmentin (Amoxicillin-Pot Clavulanate)     Upset stomach, diarrhea    Current Outpatient Prescriptions on File Prior to Visit  Medication Sig Dispense Refill  . sertraline (ZOLOFT) 25 MG tablet Take 1 tablet (25 mg total) by mouth daily. Needs OV  90 tablet  3    BP 114/76  Temp 99.9 F (37.7 C)  (Oral)  Wt 166 lb (75.297 kg)       Objective:   Physical Exam  Constitutional: She appears well-developed and well-nourished.  HENT:  Head: Normocephalic and atraumatic.  Right Ear: External ear normal.  Left Ear: External ear normal.  Mouth/Throat: Oropharynx is clear and moist.  Neck: Neck supple.       No neck tenderness  Cardiovascular: Normal rate, regular rhythm and normal heart sounds.   Pulmonary/Chest: Effort normal and breath sounds normal. She has no wheezes.          Assessment & Plan:

## 2012-04-15 NOTE — Patient Instructions (Addendum)
Drink plenty of fluids. Please call our office if your symptoms do not improve or gets worse.  

## 2012-04-15 NOTE — Assessment & Plan Note (Signed)
36 year old white female with signs and symptoms of viral URI. She has generalized achiness and low-grade fever. I recommend symptomatic treatment. Patient understands to call our office if her symptoms persist or worsen. Use tramadol for generalized achiness every 12 hours as needed.

## 2012-04-16 ENCOUNTER — Other Ambulatory Visit: Payer: Self-pay | Admitting: Internal Medicine

## 2012-04-16 ENCOUNTER — Encounter: Payer: Self-pay | Admitting: Internal Medicine

## 2012-04-16 MED ORDER — CEFUROXIME AXETIL 500 MG PO TABS: 500.0000 mg | ORAL_TABLET | Freq: Two times a day (BID) | ORAL | Status: AC

## 2012-04-22 ENCOUNTER — Encounter: Payer: Self-pay | Admitting: Internal Medicine

## 2012-04-22 DIAGNOSIS — R059 Cough, unspecified: Secondary | ICD-10-CM

## 2012-04-22 DIAGNOSIS — R05 Cough: Secondary | ICD-10-CM

## 2012-04-23 ENCOUNTER — Ambulatory Visit (INDEPENDENT_AMBULATORY_CARE_PROVIDER_SITE_OTHER)
Admission: RE | Admit: 2012-04-23 | Discharge: 2012-04-23 | Disposition: A | Discharge status: Home / Self Care | Payer: BC Managed Care – PPO | Source: Ambulatory Visit | Attending: Internal Medicine | Admitting: Internal Medicine

## 2012-04-23 DIAGNOSIS — R05 Cough: Secondary | ICD-10-CM

## 2012-04-23 DIAGNOSIS — R059 Cough, unspecified: Secondary | ICD-10-CM

## 2012-04-29 ENCOUNTER — Encounter: Payer: Self-pay | Admitting: Internal Medicine

## 2012-04-30 ENCOUNTER — Ambulatory Visit (INDEPENDENT_AMBULATORY_CARE_PROVIDER_SITE_OTHER): Payer: BC Managed Care – PPO | Admitting: Internal Medicine

## 2012-04-30 ENCOUNTER — Encounter: Payer: Self-pay | Admitting: Internal Medicine

## 2012-04-30 VITALS — BP 122/78 | Temp 99.2°F | Wt 168.0 lb

## 2012-04-30 DIAGNOSIS — J189 Pneumonia, unspecified organism: Secondary | ICD-10-CM

## 2012-04-30 MED ORDER — CEFPODOXIME PROXETIL 200 MG PO TABS: 400.0000 mg | ORAL_TABLET | Freq: Two times a day (BID) | ORAL | Status: DC

## 2012-04-30 MED ORDER — AZITHROMYCIN 250 MG PO TABS: ORAL_TABLET | ORAL | Status: DC

## 2012-04-30 MED ORDER — CEFTRIAXONE SODIUM 500 MG IJ SOLR
500.0000 mg | Freq: Once | INTRAMUSCULAR | Status: AC
  Administered 2012-04-30: 500 mg via INTRAMUSCULAR

## 2012-04-30 NOTE — Assessment & Plan Note (Signed)
Patient previously seen for possible URI. Her symptoms progressed to high fever of 103 and persistent cough. She was empirically treated with cefuroxime 500 mg twice daily. Her fever resolved while taking antibiotics. 2-3 days after stopping antibiotics, she is experiencing recurrence of low-grade fever and persistent cough. She has coarse breath sounds in left lung field. I suspect she has many community acquired pneumonia. Unfortunately she is intolerant to fluoroquinolones.  Treat with cefpodoxime 400 mg bid for 10 days and azithromycin for 5 days.  Patient given rocephin 500 mg IM x 1.  Patient advised to call office if symptoms persist or worsen. Reassess in 2 weeks. Previous CXR on 04/23/12 was normal.

## 2012-04-30 NOTE — Progress Notes (Signed)
  Subjective:    Patient ID: Stephanie Stout, female    DOB: 1975/06/29, 36 y.o.   MRN: [id number]  HPI  36 year old white female previously seen for possible upper respiratory infection for follow up. Shortly after her visit, patient called nursing staff complaining of fever of 103. Patient started on cefuroxime empirically for possible bronchitis. Patient reports her fever got much better but patient experienced persistent cough. She finished her antibiotics 2-3 days ago. She complains of developing low-grade fevers again today. She feels rundown/fatigue.  Review of Systems Negative for abdominal pain.  No urinary complaints  Past Medical History  Diagnosis Date  . Allergy   . Infertility, female   . Nephrolithiasis   . Hematuria, microscopic     negative urologic w/u, CT of abd and pelvis was negative for malignancy  . Anxiety     History   Social History  . Marital Status: Married    Spouse Name: N/A    Number of Children: N/A  . Years of Education: N/A   Occupational History  . Not on file.   Social History Main Topics  . Smoking status: Never Smoker   . Smokeless tobacco: Not on file  . Alcohol Use: No  . Drug Use: No  . Sexually Active:    Other Topics Concern  . Not on file   Social History Narrative  . No narrative on file    No past surgical history on file.  Family History  Problem Relation Age of Onset  . Hypertension Mother   . Hyperlipidemia Mother   . Hypertension Father   . Hyperlipidemia Father   . Nephrolithiasis Father   . Cancer Maternal Grandmother     liver  . Cancer Paternal Grandmother     bone  . Diabetes Paternal Grandfather     Allergies  Allergen Reactions  . Levofloxacin     REACTION: panic attacks, stomach upset  . Augmentin (Amoxicillin-Pot Clavulanate)     Upset stomach, diarrhea    Current Outpatient Prescriptions on File Prior to Visit  Medication Sig Dispense Refill  . sertraline (ZOLOFT) 25 MG tablet Take 1 tablet  (25 mg total) by mouth daily. Needs OV  90 tablet  3    BP 122/78  Temp 99.2 F (37.3 C) (Oral)  Wt 168 lb (76.204 kg)  LMP 04/16/2012       Objective:   Physical Exam  Constitutional: She is oriented to person, place, and time. She appears well-developed and well-nourished. No distress.  HENT:  Head: Normocephalic and atraumatic.  Right Ear: External ear normal.  Left Ear: External ear normal.       Mild oropharyngeal erythema  Neck: Neck supple.  Cardiovascular: Normal rate, regular rhythm and normal heart sounds.   Pulmonary/Chest: Effort normal.       Coarse breath sounds especially left midlung field  Lymphadenopathy:    She has no cervical adenopathy.  Neurological: She is alert and oriented to person, place, and time.  Skin: Skin is warm and dry.  Psychiatric: She has a normal mood and affect. Her behavior is normal.          Assessment & Plan:

## 2012-04-30 NOTE — Addendum Note (Signed)
Addended by: Alfred Levins D on: 04/30/2012 05:31 PM   Modules accepted: Orders

## 2012-05-15 ENCOUNTER — Ambulatory Visit (INDEPENDENT_AMBULATORY_CARE_PROVIDER_SITE_OTHER): Payer: BC Managed Care – PPO | Admitting: Internal Medicine

## 2012-05-15 ENCOUNTER — Encounter: Payer: Self-pay | Admitting: Internal Medicine

## 2012-05-15 VITALS — BP 112/74 | Temp 99.6°F | Wt 166.0 lb

## 2012-05-15 DIAGNOSIS — R509 Fever, unspecified: Secondary | ICD-10-CM

## 2012-05-15 LAB — HEPATIC FUNCTION PANEL
ALT: 14 U/L (ref 0–35)
Alkaline Phosphatase: 58 U/L (ref 39–117)
Bilirubin, Direct: 0 mg/dL (ref 0.0–0.3)
Total Bilirubin: 0.4 mg/dL (ref 0.3–1.2)
Total Protein: 8 g/dL (ref 6.0–8.3)

## 2012-05-15 LAB — BASIC METABOLIC PANEL
BUN: 18 mg/dL (ref 6–23)
Calcium: 9.2 mg/dL (ref 8.4–10.5)
Chloride: 101 mEq/L (ref 96–112)
Creatinine, Ser: 0.7 mg/dL (ref 0.4–1.2)
GFR: 97.51 mL/min (ref >60.00)

## 2012-05-15 LAB — CBC WITH DIFFERENTIAL/PLATELET
Basophils Relative: 0.6 % (ref 0.0–3.0)
Eosinophils Relative: 4.4 % (ref 0.0–5.0)
Lymphocytes Relative: 37.8 % (ref 12.0–46.0)
Monocytes Relative: 7.3 % (ref 3.0–12.0)
Neutrophils Relative %: 49.9 % (ref 43.0–77.0)
Platelets: 276 10*3/uL (ref 150.0–400.0)
RBC: 4.44 Mil/uL (ref 3.87–5.11)
WBC: 6.7 10*3/uL (ref 4.5–10.5)

## 2012-05-15 NOTE — Assessment & Plan Note (Signed)
36 year old female with persistent low-grade fevers and generalized achiness despite treatment for presumed bronchitis. As soon as she finishes course of antibiotics her symptoms/fever seem to recur. Low suspicion for endocarditis. However obtain blood cultures x 2. Check CBC with differential, LFTs. Consider mononucleosis-like illness. Obtain Epstein-Barr virus, cytomegalovirus, and parvovirus titers. If worsening symptoms we discussed referral to infectious disease specialist.

## 2012-05-15 NOTE — Progress Notes (Signed)
Subjective:    Patient ID: Stephanie Stout, female    DOB: [birth year], 36 y.o.   MRN: [id number]  HPI  36 year old white female for followup regarding possible pneumonia and febrile illness.  Patient initially seen on November 18. Her initial symptoms were presumed secondary to viral upper respiratory infection. She later contacted our office and alerted [place] that she was having fevers up to 103 at home along with cough. He was empirically treated with antibiotics for possible bronchitis (ceftin 500 mg bid ).  Her fever and cough improved while she was on antibiotics but as soon as antibiotics were discontinued patient had recurrence of cough and fever. Chest x-ray was negative for pneumonia.  On followup exam patient had coarse breath sounds at left lung field. She was treated with second round of antibiotics-azithromycin and cefpodoxime.  Patient reports while she was taking antibiotics, her cough significantly improved and she was feeling much better. She finished antibiotics on Friday.  Over the weekend she again experienced low-grade fevers and felt achy all over.   She has mild intermittent cough. She denies any abdominal complaints.  Review of Systems Negative for shortness of breath, negative for abdominal complaints, negative for urinary complaints    Past Medical History  Diagnosis Date  . Allergy   . Infertility, female   . Nephrolithiasis   . Hematuria, microscopic     negative urologic w/u, CT of abd and pelvis was negative for malignancy  . Anxiety     History   Social History  . Marital Status: Married    Spouse Name: N/A    Number of Children: N/A  . Years of Education: N/A   Occupational History  . Not on file.   Social History Main Topics  . Smoking status: Never Smoker   . Smokeless tobacco: Not on file  . Alcohol Use: No  . Drug Use: No  . Sexually Active:    Other Topics Concern  . Not on file   Social History Narrative  . No narrative on file    No  past surgical history on file.  Family History  Problem Relation Age of Onset  . Hypertension Mother   . Hyperlipidemia Mother   . Hypertension Father   . Hyperlipidemia Father   . Nephrolithiasis Father   . Cancer Maternal Grandmother     liver  . Cancer Paternal Grandmother     bone  . Diabetes Paternal Grandfather     Allergies  Allergen Reactions  . Levofloxacin     REACTION: panic attacks, stomach upset  . Augmentin (Amoxicillin-Pot Clavulanate)     Upset stomach, diarrhea    Current Outpatient Prescriptions on File Prior to Visit  Medication Sig Dispense Refill  . azithromycin (ZITHROMAX) 250 MG tablet Take two tablets on day one, then on tab daily for 4 days  6 tablet  0  . cefpodoxime (VANTIN) 200 MG tablet Take 2 tablets (400 mg total) by mouth 2 (two) times daily.  40 tablet  0  . sertraline (ZOLOFT) 25 MG tablet Take 1 tablet (25 mg total) by mouth daily. Needs OV  90 tablet  3    LMP 04/16/2012    Objective:   Physical Exam  Constitutional: She is oriented to person, place, and time. She appears well-developed and well-nourished.  HENT:  Head: Normocephalic and atraumatic.  Right Ear: External ear normal.  Left Ear: External ear normal.  Eyes: Conjunctivae normal and EOM are normal. Pupils are equal, round, and  reactive to light.  Neck: Neck supple.       No neck tenderness  Cardiovascular: Normal rate, regular rhythm and normal heart sounds.   No murmur heard. Pulmonary/Chest: Effort normal and breath sounds normal. She has no wheezes.  Lymphadenopathy:    She has no cervical adenopathy.  Neurological: She is alert and oriented to person, place, and time. No cranial nerve deficit.  Psychiatric: She has a normal mood and affect. Her behavior is normal.          Assessment & Plan:

## 2012-05-16 LAB — EPSTEIN-BARR VIRUS VCA ANTIBODY PANEL: EBV VCA IgM: <10 U/mL (ref <36.0)

## 2012-05-17 ENCOUNTER — Encounter: Payer: Self-pay | Admitting: Internal Medicine

## 2012-05-17 LAB — CYTOMEGALOVIRUS ANTIBODY, IGG: Cytomegalovirus Ab-IgG: 0.15 (ref <0.90)

## 2012-05-19 LAB — PARVOVIRUS B19 ANTIBODY, IGG AND IGM
Parovirus B19 IgG Abs: 3.7 index — ABNORMAL HIGH (ref <0.9)
Parovirus B19 IgM Abs: 0.1 index (ref <0.9)

## 2012-05-21 ENCOUNTER — Encounter: Payer: Self-pay | Admitting: Internal Medicine

## 2012-05-22 LAB — CULTURE, BLOOD (SINGLE): Organism ID, Bacteria: NO GROWTH

## 2012-07-13 ENCOUNTER — Other Ambulatory Visit: Payer: Self-pay

## 2012-08-01 ENCOUNTER — Encounter: Payer: Self-pay | Admitting: Internal Medicine

## 2012-11-05 ENCOUNTER — Encounter: Payer: Self-pay | Admitting: Internal Medicine

## 2012-11-05 ENCOUNTER — Ambulatory Visit (INDEPENDENT_AMBULATORY_CARE_PROVIDER_SITE_OTHER): Payer: BC Managed Care – PPO | Admitting: Internal Medicine

## 2012-11-05 VITALS — BP 112/78 | HR 72 | Temp 98.2°F | Resp 16 | Ht 64.0 in | Wt 174.0 lb

## 2012-11-05 DIAGNOSIS — M545 Low back pain, unspecified: Secondary | ICD-10-CM | POA: Insufficient documentation

## 2012-11-05 NOTE — Progress Notes (Signed)
  Subjective:    Patient ID: Stephanie Stout, female    DOB: 1976/01/27, 37 y.o.   MRN: [id number]  HPI  37 year old white female complains of intermittent low back pain for last 2 weeks. She reports episode of stomach bug 2 weeks ago. She was laying in bed more than usual. She noticed slight stiffness in lumbar spine. Her symptoms worse with prolonged sitting and also lumbar flexion. Her back feels stiff in the morning and gradually improves as the day goes on. She has 75-year-old daughter. She frequently lifts or carries her daughter. No other specific injury or trauma.  She describes sensation as "straining" sensation. Severity is rated as 5/6. There is mild radiation to bilateral hips.   Review of Systems Negative for urinary complaints    Past Medical History  Diagnosis Date  . Allergy   . Infertility, female   . Nephrolithiasis   . Hematuria, microscopic     negative urologic w/u, CT of abd and pelvis was negative for malignancy  . Anxiety     History   Social History  . Marital Status: Married    Spouse Name: N/A    Number of Children: N/A  . Years of Education: N/A   Occupational History  . Not on file.   Social History Main Topics  . Smoking status: Never Smoker   . Smokeless tobacco: Not on file  . Alcohol Use: No  . Drug Use: No  . Sexually Active:    Other Topics Concern  . Not on file   Social History Narrative  . No narrative on file    No past surgical history on file.  Family History  Problem Relation Age of Onset  . Hypertension Mother   . Hyperlipidemia Mother   . Hypertension Father   . Hyperlipidemia Father   . Nephrolithiasis Father   . Cancer Maternal Grandmother     liver  . Cancer Paternal Grandmother     bone  . Diabetes Paternal Grandfather     Allergies  Allergen Reactions  . Levofloxacin     REACTION: panic attacks, stomach upset  . Augmentin (Amoxicillin-Pot Clavulanate)     Upset stomach, diarrhea    Current Outpatient  Prescriptions on File Prior to Visit  Medication Sig Dispense Refill  . sertraline (ZOLOFT) 25 MG tablet Take 1 tablet (25 mg total) by mouth daily. Needs OV  90 tablet  3   No current facility-administered medications on file prior to visit.    BP 112/78  Pulse 72  Temp(Src) 98.2 F (36.8 C)  Resp 16  Ht 5\' 4"  (1.626 m)  Wt 174 lb (78.926 kg)  BMI 29.85 kg/m2    Objective:   Physical Exam  Constitutional: She appears well-developed and well-nourished.  Cardiovascular: Normal rate, regular rhythm and normal heart sounds.   Pulmonary/Chest: Effort normal. She has no wheezes.  Musculoskeletal:  Limited lumbar flexion, no pain with lumbar extension or side bending. Negative straight leg test. Patient able to squat, toe and heel walk without difficulty  Neurological: She displays normal reflexes. She exhibits normal muscle tone.  Psychiatric: She has a normal mood and affect. Her behavior is normal.          Assessment & Plan:

## 2012-11-05 NOTE — Patient Instructions (Addendum)
Perform low back exercises as directed Use ibuprofen 400-600 mg twice daily as needed Please contact our office if your symptoms do not improve or gets worse.

## 2012-11-05 NOTE — Assessment & Plan Note (Signed)
37 year old white female with uncomplicated low back pain. Utilized myofacial release techniques and HVLA to lumbosacral spine. Patient had good response with no complications. I reviewed home exercises.  Patient advised to call office if symptoms persist or worsen.

## 2012-12-12 ENCOUNTER — Ambulatory Visit (INDEPENDENT_AMBULATORY_CARE_PROVIDER_SITE_OTHER): Payer: BC Managed Care – PPO | Admitting: Family Medicine

## 2012-12-12 ENCOUNTER — Ambulatory Visit: Payer: BC Managed Care – PPO | Admitting: Internal Medicine

## 2012-12-12 ENCOUNTER — Encounter: Payer: Self-pay | Admitting: Family Medicine

## 2012-12-12 VITALS — BP 100/72 | HR 81 | Temp 99.4°F | Ht 64.0 in | Wt 175.0 lb

## 2012-12-12 DIAGNOSIS — B9789 Other viral agents as the cause of diseases classified elsewhere: Secondary | ICD-10-CM

## 2012-12-12 DIAGNOSIS — R5381 Other malaise: Secondary | ICD-10-CM

## 2012-12-12 DIAGNOSIS — F410 Panic disorder [episodic paroxysmal anxiety] without agoraphobia: Secondary | ICD-10-CM

## 2012-12-12 DIAGNOSIS — B349 Viral infection, unspecified: Secondary | ICD-10-CM

## 2012-12-12 DIAGNOSIS — R790 Abnormal level of blood mineral: Secondary | ICD-10-CM

## 2012-12-12 DIAGNOSIS — K589 Irritable bowel syndrome without diarrhea: Secondary | ICD-10-CM

## 2012-12-12 DIAGNOSIS — K219 Gastro-esophageal reflux disease without esophagitis: Secondary | ICD-10-CM

## 2012-12-12 DIAGNOSIS — E785 Hyperlipidemia, unspecified: Secondary | ICD-10-CM

## 2012-12-12 DIAGNOSIS — R5383 Other fatigue: Secondary | ICD-10-CM

## 2012-12-12 DIAGNOSIS — R7989 Other specified abnormal findings of blood chemistry: Secondary | ICD-10-CM

## 2012-12-12 LAB — HEPATIC FUNCTION PANEL
Alkaline Phosphatase: 58 U/L (ref 39–117)
Bilirubin, Direct: 0.1 mg/dL (ref 0.0–0.3)
Indirect Bilirubin: 0.5 mg/dL (ref 0.0–0.9)

## 2012-12-12 LAB — CBC
Platelets: 288 10*3/uL (ref 150–400)
RBC: 4.7 MIL/uL (ref 3.87–5.11)
RDW: 13 % (ref 11.5–15.5)
WBC: 7.7 10*3/uL (ref 4.0–10.5)

## 2012-12-12 LAB — RENAL FUNCTION PANEL
BUN: 19 mg/dL (ref 6–23)
Calcium: 10.1 mg/dL (ref 8.4–10.5)
Chloride: 99 mEq/L (ref 96–112)
Glucose, Bld: 96 mg/dL (ref 70–99)
Phosphorus: 3.9 mg/dL (ref 2.3–4.6)
Potassium: 4.5 mEq/L (ref 3.5–5.3)
Sodium: 136 mEq/L (ref 135–145)

## 2012-12-12 LAB — IRON: Iron: 156 ug/dL — ABNORMAL HIGH (ref 42–145)

## 2012-12-12 LAB — LIPID PANEL
Cholesterol: 216 mg/dL — ABNORMAL HIGH (ref 0–200)
Total CHOL/HDL Ratio: 3.1 Ratio
VLDL: 19 mg/dL (ref 0–40)

## 2012-12-12 NOTE — Patient Instructions (Signed)
MegaRed Cap, krill oil daily Digestive Advantage caps, probiotics daily Start a Multivitamin with minerals   Fatigue Fatigue is a feeling of tiredness, lack of energy, lack of motivation, or feeling tired all the time. Having enough rest, good nutrition, and reducing stress will normally reduce fatigue. Consult your caregiver if it persists. The nature of your fatigue will help your caregiver to find out its cause. The treatment is based on the cause.  CAUSES  There are many causes for fatigue. Most of the time, fatigue can be traced to one or more of your habits or routines. Most causes fit into one or more of three general areas. They are: Lifestyle problems  Sleep disturbances.  Overwork.  Physical exertion.  Unhealthy habits.  Poor eating habits or eating disorders.  Alcohol and/or drug use .  Lack of proper nutrition (malnutrition). Psychological problems  Stress and/or anxiety problems.  Depression.  Grief.  Boredom. Medical Problems or Conditions  Anemia.  Pregnancy.  Thyroid gland problems.  Recovery from major surgery.  Continuous pain.  Emphysema or asthma that is not well controlled  Allergic conditions.  Diabetes.  Infections (such as mononucleosis).  Obesity.  Sleep disorders, such as sleep apnea.  Heart failure or other heart-related problems.  Cancer.  Kidney disease.  Liver disease.  Effects of certain medicines such as antihistamines, cough and cold remedies, prescription pain medicines, heart and blood pressure medicines, drugs used for treatment of cancer, and some antidepressants. SYMPTOMS  The symptoms of fatigue include:   Lack of energy.  Lack of drive (motivation).  Drowsiness.  Feeling of indifference to the surroundings. DIAGNOSIS  The details of how you feel help guide your caregiver in finding out what is causing the fatigue. You will be asked about your present and past health condition. It is important to  review all medicines that you take, including prescription and non-prescription items. A thorough exam will be done. You will be questioned about your feelings, habits, and normal lifestyle. Your caregiver may suggest blood tests, urine tests, or other tests to look for common medical causes of fatigue.  TREATMENT  Fatigue is treated by correcting the underlying cause. For example, if you have continuous pain or depression, treating these causes will improve how you feel. Similarly, adjusting the dose of certain medicines will help in reducing fatigue.  HOME CARE INSTRUCTIONS   Try to get the required amount of good sleep every night.  Eat a healthy and nutritious diet, and drink enough water throughout the day.  Practice ways of relaxing (including yoga or meditation).  Exercise regularly.  Make plans to change situations that cause stress. Act on those plans so that stresses decrease over time. Keep your work and personal routine reasonable.  Avoid street drugs and minimize use of alcohol.  Start taking a daily multivitamin after consulting your caregiver. SEEK MEDICAL CARE IF:   You have persistent tiredness, which cannot be accounted for.  You have fever.  You have unintentional weight loss.  You have headaches.  You have disturbed sleep throughout the night.  You are feeling sad.  You have constipation.  You have dry skin.  You have gained weight.  You are taking any new or different medicines that you suspect are causing fatigue.  You are unable to sleep at night.  You develop any unusual swelling of your legs or other parts of your body. SEEK IMMEDIATE MEDICAL CARE IF:   You are feeling confused.  Your vision is blurred.  You  feel faint or pass out.  You develop severe headache.  You develop severe abdominal, pelvic, or back pain.  You develop chest pain, shortness of breath, or an irregular or fast heartbeat.  You are unable to pass a normal amount of  urine.  You develop abnormal bleeding such as bleeding from the rectum or you vomit blood.  You have thoughts about harming yourself or committing suicide.  You are worried that you might harm someone else. MAKE SURE YOU:   Understand these instructions.  Will watch your condition.  Will get help right away if you are not doing well or get worse. Document Released: 03/12/2007 Document Revised: 08/07/2011 Document Reviewed: 03/12/2007 [facility] Patient Information 2014 [place], [place]. Viral Infections A virus is a type of germ. Viruses can cause:  Minor sore throats.  Aches and pains.  Headaches.  Runny nose.  Rashes.  Watery eyes.  Tiredness.  Coughs.  Loss of appetite.  Feeling sick to your stomach (nausea).  Throwing up (vomiting).  Watery poop (diarrhea). HOME CARE   Only take medicines as told by your doctor.  Drink enough water and fluids to keep your pee (urine) clear or pale yellow. Sports drinks are a good choice.  Get plenty of rest and eat healthy. Soups and broths with crackers or rice are fine. GET HELP RIGHT AWAY IF:   You have a very bad headache.  You have shortness of breath.  You have chest pain or neck pain.  You have an unusual rash.  You cannot stop throwing up.  You have watery poop that does not stop.  You cannot keep fluids down.  You or your child has a temperature by mouth above 102 F (38.9 C), not controlled by medicine.  Your baby is older than 3 months with a rectal temperature of 102 F (38.9 C) or higher.  Your baby is 3 months old or younger with a rectal temperature of 100.4 F (38 C) or higher. MAKE SURE YOU:   Understand these instructions.  Will watch this condition.  Will get help right away if you are not doing well or get worse. Document Released: 04/27/2008 Document Revised: 08/07/2011 Document Reviewed: 09/20/2010 [facility] Patient Information 2014 [place], [place].

## 2012-12-13 ENCOUNTER — Encounter: Payer: Self-pay | Admitting: Family Medicine

## 2012-12-13 ENCOUNTER — Ambulatory Visit: Payer: BC Managed Care – PPO | Admitting: Family Medicine

## 2012-12-13 ENCOUNTER — Ambulatory Visit (INDEPENDENT_AMBULATORY_CARE_PROVIDER_SITE_OTHER): Payer: BC Managed Care – PPO | Admitting: Family Medicine

## 2012-12-13 VITALS — BP 118/70 | HR 83 | Temp 98.8°F | Ht 64.0 in | Wt 175.0 lb

## 2012-12-13 DIAGNOSIS — J029 Acute pharyngitis, unspecified: Secondary | ICD-10-CM

## 2012-12-13 DIAGNOSIS — R7989 Other specified abnormal findings of blood chemistry: Secondary | ICD-10-CM

## 2012-12-13 DIAGNOSIS — R5383 Other fatigue: Secondary | ICD-10-CM

## 2012-12-13 DIAGNOSIS — R790 Abnormal level of blood mineral: Secondary | ICD-10-CM | POA: Insufficient documentation

## 2012-12-13 DIAGNOSIS — E785 Hyperlipidemia, unspecified: Secondary | ICD-10-CM

## 2012-12-13 DIAGNOSIS — R5381 Other malaise: Secondary | ICD-10-CM | POA: Insufficient documentation

## 2012-12-13 HISTORY — DX: Other malaise: R53.83

## 2012-12-13 HISTORY — DX: Acute pharyngitis, unspecified: J02.9

## 2012-12-13 HISTORY — DX: Hyperlipidemia, unspecified: E78.5

## 2012-12-13 HISTORY — DX: Other malaise: R53.81

## 2012-12-13 HISTORY — DX: Abnormal level of blood mineral: R79.0

## 2012-12-13 LAB — FERRITIN: Ferritin: 27 ng/mL (ref 10–291)

## 2012-12-13 LAB — POCT RAPID STREP A (OFFICE): Rapid Strep A Screen: NEGATIVE

## 2012-12-13 LAB — SEDIMENTATION RATE: Sed Rate: 1 mm/hr (ref 0–22)

## 2012-12-13 LAB — T4, FREE: Free T4: 1.12 ng/dL (ref 0.80–1.80)

## 2012-12-13 MED ORDER — AMOXICILLIN 500 MG PO CAPS: 500.0000 mg | ORAL_CAPSULE | Freq: Three times a day (TID) | ORAL | Status: DC

## 2012-12-13 NOTE — Assessment & Plan Note (Signed)
Mild recheck at next visit

## 2012-12-13 NOTE — Progress Notes (Signed)
Patient ID: Stephanie Stout, female   DOB: September 11, 1975, 37 y.o.   MRN: [id number] Stephanie Stout [id number] [birth year] 12/13/2012      Progress Note-Follow Up  Subjective  Chief Complaint  Chief Complaint  Patient presents with  . Sore Throat    last night fever and sore throat started    HPI  Patient is a 37 year old Caucasian female who is here today for sudden onset sore throat. She was actually seen yesterday and noted to have some mild cervical lymphadenopathy and erythema in her throat. Then last night she developed fevers, chills, malaise, myalgias and sore throat. Her husband has actually been sick with similar symptoms for several days now and is worsening. She is use Tylenol and Advil for control her symptoms overnight but is here today for evaluation. He is concerned that her 14-year-old daughter not get sick.  Past Medical History  Diagnosis Date  . Allergy   . Infertility, female   . Nephrolithiasis   . Hematuria, microscopic     negative urologic w/u, CT of abd and pelvis was negative for malignancy  . Anxiety   . Acute pharyngitis 12/13/2012    History reviewed. No pertinent past surgical history.  Family History  Problem Relation Age of Onset  . Hypertension Mother   . Hyperlipidemia Mother   . Hypertension Father   . Hyperlipidemia Father   . Nephrolithiasis Father   . Cancer Maternal Grandmother     liver  . Cancer Paternal Grandmother     bone  . Diabetes Paternal Grandfather     History   Social History  . Marital Status: Married    Spouse Name: N/A    Number of Children: N/A  . Years of Education: N/A   Occupational History  . Not on file.   Social History Main Topics  . Smoking status: Never Smoker   . Smokeless tobacco: Not on file  . Alcohol Use: No  . Drug Use: No  . Sexually Active:    Other Topics Concern  . Not on file   Social History Narrative  . No narrative on file    Current Outpatient Prescriptions on File Prior to Visit   Medication Sig Dispense Refill  . sertraline (ZOLOFT) 25 MG tablet Take 1 tablet (25 mg total) by mouth daily. Needs OV  90 tablet  3   No current facility-administered medications on file prior to visit.    Allergies  Allergen Reactions  . Levofloxacin     REACTION: panic attacks, stomach upset  . Augmentin (Amoxicillin-Pot Clavulanate)     Upset stomach, diarrhea    Review of Systems  Review of Systems  Constitutional: Positive for fever, chills and malaise/fatigue.  HENT: Positive for sore throat and neck pain. Negative for congestion.   Eyes: Negative for pain and discharge.  Respiratory: Negative for shortness of breath.   Cardiovascular: Negative for chest pain, palpitations and leg swelling.  Gastrointestinal: Negative for nausea, abdominal pain and diarrhea.  Genitourinary: Negative for dysuria.  Musculoskeletal: Positive for myalgias. Negative for falls.  Skin: Negative for rash.  Neurological: Positive for headaches. Negative for loss of consciousness.  Endo/Heme/Allergies: Negative for polydipsia.  Psychiatric/Behavioral: Negative for depression and suicidal ideas. The patient is not nervous/anxious and does not have insomnia.     Objective  BP 118/70  Pulse 83  Temp(Src) 98.8 F (37.1 C) (Oral)  Ht 5\' 4"  (1.626 m)  Wt 175 lb 0.6 oz (79.398 kg)  BMI 30.03 kg/m2  SpO2 97%  LMP 12/02/2012  Physical Exam  Physical Exam  Constitutional: She is oriented to person, place, and time and well-developed, well-nourished, and in no distress. No distress.  HENT:  Head: Normocephalic and atraumatic.  Oropharynx erythematous and edematous. Tonsils 2 + b/l  Eyes: Conjunctivae are normal.  Neck: Neck supple. No thyromegaly present.  b/l  Cardiovascular: Normal rate, regular rhythm and normal heart sounds.  Exam reveals no gallop.   No murmur heard. Pulmonary/Chest: Effort normal and breath sounds normal. She has no wheezes.  Abdominal: She exhibits no distension  and no mass.  Musculoskeletal: She exhibits no edema.  Lymphadenopathy:    She has cervical adenopathy.  Neurological: She is alert and oriented to person, place, and time.  Skin: Skin is warm and dry. No rash noted. She is not diaphoretic.  Psychiatric: Memory, affect and judgment normal.    Lab Results  Component Value Date   TSH 2.408 12/12/2012   Lab Results  Component Value Date   WBC 7.7 12/12/2012   HGB 13.9 12/12/2012   HCT 41.8 12/12/2012   MCV 88.9 12/12/2012   PLT 288 12/12/2012   Lab Results  Component Value Date   CREATININE 0.69 12/12/2012   BUN 19 12/12/2012   NA 136 12/12/2012   K 4.5 12/12/2012   CL 99 12/12/2012   CO2 27 12/12/2012   Lab Results  Component Value Date   ALT 21 12/12/2012   AST 24 12/12/2012   ALKPHOS 58 12/12/2012   BILITOT 0.6 12/12/2012   Lab Results  Component Value Date   CHOL 216* 12/12/2012   Lab Results  Component Value Date   HDL 70 12/12/2012   Lab Results  Component Value Date   LDLCALC 127* 12/12/2012   Lab Results  Component Value Date   TRIG 93 12/12/2012   Lab Results  Component Value Date   CHOLHDL 3.1 12/12/2012     Assessment & Plan  Acute pharyngitis Rapid strep negative. Sent for confirmation culture. Encouraged increased rest and fluids, probiotics and given an rx for Amoxicillin in case symptoms worsen with increased fever and swelling in throat.  Other and unspecified hyperlipidemia Mild, avoid trans fats, increase exercise and start a krill oil cap  Abnormal blood level of iron Cbc and ferritin normal but iron stores up slightly, will repeat level at visit next month and consider referral if persists

## 2012-12-13 NOTE — Assessment & Plan Note (Signed)
Mild, avoid trans fats, increase exercise and start a krill oil cap

## 2012-12-13 NOTE — Assessment & Plan Note (Signed)
Add probiotics, avoid offending foods, do not eat too close to bedtime

## 2012-12-13 NOTE — Progress Notes (Signed)
Patient ID: Stephanie Stout, female   DOB: 08/06/1975, 37 y.o.   MRN: [id number] Stephanie Stout [id number] [birth year] 12/13/2012      Progress Note-Follow Up  Subjective  Chief Complaint  Chief Complaint  Patient presents with  . Fatigue    X 2 months  . Generalized Body Aches    X 2 months     HPI  Patient is a 37 year old Caucasian female who is in today with complaints of fatigue and malaise. She has had some myalgias which are generalized as well. They improve and worsen but never fully resolved. They've been present for at least a couple of months and she reports really she's not felt 100% since she struggled with respiratory illness back in November and December. She had gastrointestinal symptoms a couple months ago as symptoms worsened at that time. She diffuse myalgias and arthralgias including low back pain. Has a toddler at home which she also notes over the last 2 months she's had disproportionate level of fatigue. No complaints of cough, headache, ear pain, chest pain, palpitations, shortness of breath or GU concerns. She's had no recent panic attacks with the sertraline. Reports that her IBS has been somewhat better with the sertraline as well  Past Medical History  Diagnosis Date  . Allergy   . Infertility, female   . Nephrolithiasis   . Hematuria, microscopic     negative urologic w/u, CT of abd and pelvis was negative for malignancy  . Anxiety   . Acute pharyngitis 12/13/2012  . Abnormal blood level of iron 12/13/2012  . Other and unspecified hyperlipidemia 12/13/2012  . Other malaise and fatigue 12/13/2012    History reviewed. No pertinent past surgical history.  Family History  Problem Relation Age of Onset  . Hypertension Mother   . Hyperlipidemia Mother   . Hypertension Father   . Hyperlipidemia Father   . Nephrolithiasis Father   . Cancer Maternal Grandmother     liver  . Cancer Paternal Grandmother     bone  . Diabetes Paternal Grandfather     History    Social History  . Marital Status: Married    Spouse Name: N/A    Number of Children: N/A  . Years of Education: N/A   Occupational History  . Not on file.   Social History Main Topics  . Smoking status: Never Smoker   . Smokeless tobacco: Not on file  . Alcohol Use: No  . Drug Use: No  . Sexually Active:    Other Topics Concern  . Not on file   Social History Narrative  . No narrative on file    Current Outpatient Prescriptions on File Prior to Visit  Medication Sig Dispense Refill  . sertraline (ZOLOFT) 25 MG tablet Take 1 tablet (25 mg total) by mouth daily. Needs OV  90 tablet  3   No current facility-administered medications on file prior to visit.    Allergies  Allergen Reactions  . Levofloxacin     REACTION: panic attacks, stomach upset  . Augmentin (Amoxicillin-Pot Clavulanate)     Upset stomach, diarrhea    Review of Systems  Review of Systems  Constitutional: Negative for fever and malaise/fatigue.  HENT: Negative for congestion.   Eyes: Negative for pain and discharge.  Respiratory: Negative for shortness of breath.   Cardiovascular: Negative for chest pain, palpitations and leg swelling.  Gastrointestinal: Negative for nausea, abdominal pain and diarrhea.  Genitourinary: Negative for dysuria.  Musculoskeletal: Negative for falls.  Skin: Negative for rash.  Neurological: Negative for loss of consciousness and headaches.  Endo/Heme/Allergies: Negative for polydipsia.  Psychiatric/Behavioral: Negative for depression and suicidal ideas. The patient is not nervous/anxious and does not have insomnia.     Objective  BP 100/72  Pulse 81  Temp(Src) 99.4 F (37.4 C) (Oral)  Ht 5\' 4"  (1.626 m)  Wt 175 lb (79.379 kg)  BMI 30.02 kg/m2  SpO2 98%  LMP 12/02/2012  Physical Exam  Physical Exam  Constitutional: She is oriented to person, place, and time and well-developed, well-nourished, and in no distress. No distress.  HENT:  Head: Normocephalic  and atraumatic.  Right Ear: External ear normal.  Left Ear: External ear normal.  Nose: Nose normal.  Mouth/Throat: Oropharynx is clear and moist. No oropharyngeal exudate.  Mild erythema posterior oropharynx no edema or exudate  Eyes: Conjunctivae are normal. Pupils are equal, round, and reactive to light. Right eye exhibits no discharge. Left eye exhibits no discharge. No scleral icterus.  Neck: Normal range of motion. Neck supple. No thyromegaly present.  Cardiovascular: Normal rate, regular rhythm, normal heart sounds and intact distal pulses.  Exam reveals no gallop.   No murmur heard. Pulmonary/Chest: Effort normal and breath sounds normal. No respiratory distress. She has no wheezes. She has no rales.  Abdominal: Soft. Bowel sounds are normal. She exhibits no distension and no mass. There is no tenderness.  Musculoskeletal: Normal range of motion. She exhibits no edema and no tenderness.  Lymphadenopathy:    She has cervical adenopathy.  Neurological: She is alert and oriented to person, place, and time. She has normal reflexes. No cranial nerve deficit. Coordination normal.  Skin: Skin is warm and dry. No rash noted. She is not diaphoretic.  Psychiatric: Mood, memory and affect normal.    Lab Results  Component Value Date   TSH 2.408 12/12/2012   Lab Results  Component Value Date   WBC 7.7 12/12/2012   HGB 13.9 12/12/2012   HCT 41.8 12/12/2012   MCV 88.9 12/12/2012   PLT 288 12/12/2012   Lab Results  Component Value Date   CREATININE 0.69 12/12/2012   BUN 19 12/12/2012   NA 136 12/12/2012   K 4.5 12/12/2012   CL 99 12/12/2012   CO2 27 12/12/2012   Lab Results  Component Value Date   ALT 21 12/12/2012   AST 24 12/12/2012   ALKPHOS 58 12/12/2012   BILITOT 0.6 12/12/2012   Lab Results  Component Value Date   CHOL 216* 12/12/2012   Lab Results  Component Value Date   HDL 70 12/12/2012   Lab Results  Component Value Date   LDLCALC 127* 12/12/2012   Lab Results  Component  Value Date   TRIG 93 12/12/2012   Lab Results  Component Value Date   CHOLHDL 3.1 12/12/2012     Assessment & Plan  Other malaise and fatigue Present for several months since a gastroenteritis. In retrospect has not felt 100% since struggling with respiratory illness in November and December. Labs without obvious concerns. Will encourage increased rest and hydration, probiotics, krill oil caps and reassess at next visit. Greatest c/o is fatigue and she does have a toddler at home.  IRRITABLE BOWEL SYNDROME Encouraged probiotics, clean diet and good hydration. Continue Sertraline which she reports is helping  Abnormal blood level of iron Mild recheck at next visit  PANIC ATTACK Doing well on Sertraline  GERD Add probiotics, avoid offending foods, do not eat too close to bedtime

## 2012-12-13 NOTE — Assessment & Plan Note (Signed)
Cbc and ferritin normal but iron stores up slightly, will repeat level at visit next month and consider referral if persists

## 2012-12-13 NOTE — Patient Instructions (Addendum)

## 2012-12-13 NOTE — Assessment & Plan Note (Signed)
Encouraged probiotics, clean diet and good hydration. Continue Sertraline which she reports is helping

## 2012-12-13 NOTE — Assessment & Plan Note (Signed)
Rapid strep negative. Sent for confirmation culture. Encouraged increased rest and fluids, probiotics and given an rx for Amoxicillin in case symptoms worsen with increased fever and swelling in throat.

## 2012-12-13 NOTE — Assessment & Plan Note (Signed)
Present for several months since a gastroenteritis. In retrospect has not felt 100% since struggling with respiratory illness in November and December. Labs without obvious concerns. Will encourage increased rest and hydration, probiotics, krill oil caps and reassess at next visit. Greatest c/o is fatigue and she does have a toddler at home.

## 2012-12-13 NOTE — Assessment & Plan Note (Signed)
Doing well on Sertraline 

## 2012-12-16 ENCOUNTER — Other Ambulatory Visit: Payer: Self-pay | Admitting: Family Medicine

## 2012-12-16 ENCOUNTER — Telehealth: Payer: Self-pay | Admitting: Family Medicine

## 2012-12-16 MED ORDER — LIDOCAINE VISCOUS 2 % MT SOLN: OROMUCOSAL | Status: DC

## 2012-12-16 NOTE — Telephone Encounter (Signed)
Please advise 

## 2012-12-16 NOTE — Telephone Encounter (Signed)
Blisters and sores in mouth suspicious for hand, foot and mouth which is a virus so the antibiotic will not help. Yes she is contagious. So she should start a zinc supplement increase her fluids. THe antibiotics probably will not help this, we can call in some viscous lidocaine 2% 5-10 cc swish and spit qid prn severe pain if she needs it. Disp #120 ml, 1 rf

## 2012-12-16 NOTE — Telephone Encounter (Signed)
Patient Information:  Caller Name: Anamari  Phone: [phone]  Patient: Stephanie Stout, Stephanie Stout  Gender: Female  DOB: 05/14/1976  Age: 37 Years  PCP: Danise [facility])  Pregnant: No  Office Follow Up:  Does the office need to follow up with this patient?: Yes  Instructions For The Office: Please call back regarding need to return to clinic vs awaiting throat culture results since already on antibiotic.  RN Note:  No contraception/infertility. Kimyatta suspects hand foot and mouth disease due to new symptoms.  Sore throat pain rated 6/10 when swallows. Instructed not to stop antibiotic until throat culture results known. Educated hand foot and mouth disease is not contagious after fever ends.   Symptoms  Reason For Call & Symptoms: Seen 12/13/12 for sore throat. Rapid strep test negative. Final culture pending.  Started taking Amoxicillin 12/13/12 due to fever. Fever lasted 48 hours.  Reports new onset of red spots on palms of hand and between webs of fingers and more red blisters in mouth under tongue, inside cheeks, on roof of mouth and on tonsils.  Sore throat pain is worse.  Asking is she should continue the antibiotic and if contagious since expecting out of town guests.  Reviewed Health History In EMR: Yes  Reviewed Medications In EMR: Yes  Reviewed Allergies In EMR: Yes  Reviewed Surgeries / Procedures: Yes  Date of Onset of Symptoms: 12/12/2012  Treatments Tried: Tylenol. Ibuprofen, warm fluids and honey  Treatments Tried Worked: Yes OB / GYN:  LMP: 12/02/2012  Guideline(s) Used:  Sore Throat  Disposition Per Guideline:   See Today in Office  Reason For Disposition Reached:   Pus on tonsils (back of throat) and swollen neck lymph nodes ("glands")  Advice Given:  For Relief of Sore Throat Pain:  Sip warm chicken broth or apple juice.  Suck on hard candy or a throat lozenge (over-the-counter).  Gargle warm salt water 3 times daily (1 teaspoon of salt in 8 oz or 240 ml of  warm water).  Pain Medicines:  For pain relief, you can take either acetaminophen, ibuprofen, or naproxen.  Ibuprofen (e.g., Motrin, Advil):  Take 400 mg (two 200 mg pills) by mouth every 6 hours.  Another choice is to take 600 mg (three 200 mg pills) by mouth every 8 hours.  The most you should take each day is 1,200 mg (six 200 mg pills), unless your doctor has told you to take more.  Soft Diet:   Cold drinks and milk shakes are especially good (Reason: swollen tonsils can make some foods hard to swallow).  Contagiousness:   You can return to work or school after the fever is gone and you feel well enough to participate in normal activities. If your doctor determines that you have Strep throat, then you will need to take an antibiotic for 24 hours before you can return.  Liquids:  Adequate liquid intake is important to prevent dehydration. Drink 6-8 glasses of water per day.  Expected Course:  Sore throats with viral illnesses usually last 3 or 4 days.  Call Back If:  Sore throat is mild but lasts longer than 4 days  Fever lasts longer than 3 days  You become worse.  RN Overrode Recommendation:  Follow Up With Office Later  Please advise if needs to return to clinic; seen 12/13/12, on antibiotics, and final throat culture is pending.

## 2012-12-16 NOTE — Telephone Encounter (Signed)
Patient informed and states that it was in her mouth but now she has blisters on her hands and feet.   Pt would like RX sent to pharmacy  Pt would also like to know how long she is contagious because her parents are supposed to come into town this weekend?  Please advise?

## 2012-12-16 NOTE — Telephone Encounter (Signed)
So when the blisters and the ulcers in mouth stop spreading and start receding she is much less contagious. I have sent in the lidocaine

## 2012-12-16 NOTE — Telephone Encounter (Signed)
No I have not, you did

## 2012-12-16 NOTE — Telephone Encounter (Signed)
Patient informed and voiced understanding

## 2013-02-13 ENCOUNTER — Other Ambulatory Visit: Payer: Self-pay | Admitting: Internal Medicine

## 2013-02-14 ENCOUNTER — Other Ambulatory Visit: Payer: Self-pay | Admitting: Internal Medicine

## 2013-04-03 ENCOUNTER — Other Ambulatory Visit: Payer: Self-pay

## 2013-05-07 ENCOUNTER — Ambulatory Visit (HOSPITAL_BASED_OUTPATIENT_CLINIC_OR_DEPARTMENT_OTHER)
Admission: RE | Admit: 2013-05-07 | Discharge: 2013-05-07 | Disposition: A | Discharge status: Home / Self Care | Payer: BC Managed Care – PPO | Source: Ambulatory Visit | Attending: Physician Assistant | Admitting: Physician Assistant

## 2013-05-07 ENCOUNTER — Ambulatory Visit (INDEPENDENT_AMBULATORY_CARE_PROVIDER_SITE_OTHER): Payer: BC Managed Care – PPO | Admitting: Physician Assistant

## 2013-05-07 ENCOUNTER — Encounter: Payer: Self-pay | Admitting: Physician Assistant

## 2013-05-07 VITALS — BP 120/90 | HR 79 | Temp 98.5°F | Ht 64.0 in | Wt 185.0 lb

## 2013-05-07 DIAGNOSIS — J209 Acute bronchitis, unspecified: Secondary | ICD-10-CM

## 2013-05-07 DIAGNOSIS — R069 Unspecified abnormalities of breathing: Secondary | ICD-10-CM | POA: Insufficient documentation

## 2013-05-07 MED ORDER — AZITHROMYCIN 250 MG PO TABS: ORAL_TABLET | ORAL | Status: DC

## 2013-05-07 MED ORDER — BENZONATATE 100 MG PO CAPS: 100.0000 mg | ORAL_CAPSULE | Freq: Three times a day (TID) | ORAL | Status: DC | PRN

## 2013-05-07 NOTE — Progress Notes (Signed)
Pre visit review using our clinic review tool, if applicable. No additional management support is needed unless otherwise documented below in the visit note. 

## 2013-05-07 NOTE — Patient Instructions (Signed)
Please take antibiotic as prescribed.  Increase fluid intake.  Saline nasal spray.  Rest. Humidifier in bedroom.  Tessalon Perles for cough.  Please go downstairs for chest x-ray.  I will call you with your results.    Bronchitis Bronchitis is the body's way of reacting to injury and/or infection (inflammation) of the bronchi. Bronchi are the air tubes that extend from the windpipe into the lungs. If the inflammation becomes severe, it may cause shortness of breath. CAUSES  Inflammation may be caused by:  A virus.  Germs (bacteria).  Dust.  Allergens.  Pollutants and many other irritants. The cells lining the bronchial tree are covered with tiny hairs (cilia). These constantly beat upward, away from the lungs, toward the mouth. This keeps the lungs free of pollutants. When these cells become too irritated and are unable to do their job, mucus begins to develop. This causes the characteristic cough of bronchitis. The cough clears the lungs when the cilia are unable to do their job. Without either of these protective mechanisms, the mucus would settle in the lungs. Then you would develop pneumonia. Smoking is a common cause of bronchitis and can contribute to pneumonia. Stopping this habit is the single most important thing you can do to help yourself. TREATMENT   Your caregiver may prescribe an antibiotic if the cough is caused by bacteria. Also, medicines that open up your airways make it easier to breathe. Your caregiver may also recommend or prescribe an expectorant. It will loosen the mucus to be coughed up. Only take over-the-counter or prescription medicines for pain, discomfort, or fever as directed by your caregiver.  Removing whatever causes the problem (smoking, for example) is critical to preventing the problem from getting worse.  Cough suppressants may be prescribed for relief of cough symptoms.  Inhaled medicines may be prescribed to help with symptoms now and to help prevent  problems from returning.  For those with recurrent (chronic) bronchitis, there may be a need for steroid medicines. SEEK IMMEDIATE MEDICAL CARE IF:   During treatment, you develop more pus-like mucus (purulent sputum).  You have a fever.  You become progressively more ill.  You have increased difficulty breathing, wheezing, or shortness of breath. It is necessary to seek immediate medical care if you are elderly or sick from any other disease. MAKE SURE YOU:   Understand these instructions.  Will watch your condition.  Will get help right away if you are not doing well or get worse. Document Released: 05/15/2005 Document Revised: 01/15/2013 Document Reviewed: 01/07/2013 [facility] Patient Information 2014 [place], [place].

## 2013-05-07 NOTE — Progress Notes (Signed)
Quick Note:  Patient Informed and voiced understanding ______ 

## 2013-05-07 NOTE — Progress Notes (Signed)
Patient ID: Stephanie Stout, female   DOB: 02-17-1976, 37 y.o.   MRN: [id number]  Patient presents to clinic today complaining of nasal congestion, chest congestion and nonproductive cough. Symptoms have been present for greater than one week. Patient endorses transient fevers. Denies muscle aches, chills, malaise or fatigue. Patient denies history of asthma. Patient does have seasonal allergies. Patient tries several things over-the-counter, with minimal relief of symptoms. Patient denies recent travel or sick contact. patient states she feels like her cough is getting worse.    Past Medical History  Diagnosis Date  . Allergy   . Infertility, female   . Nephrolithiasis   . Hematuria, microscopic     negative urologic w/u, CT of abd and pelvis was negative for malignancy  . Anxiety   . Acute pharyngitis 12/13/2012  . Abnormal blood level of iron 12/13/2012  . Other and unspecified hyperlipidemia 12/13/2012  . Other malaise and fatigue 12/13/2012    Current Outpatient Prescriptions on File Prior to Visit  Medication Sig Dispense Refill  . sertraline (ZOLOFT) 25 MG tablet Take 1 tablet (25 mg total) by mouth daily.  90 tablet  1   No current facility-administered medications on file prior to visit.    Allergies  Allergen Reactions  . Levofloxacin     REACTION: panic attacks, stomach upset  . Augmentin [Amoxicillin-Pot Clavulanate]     Upset stomach, diarrhea    Family History  Problem Relation Age of Onset  . Hypertension Mother   . Hyperlipidemia Mother   . Hypertension Father   . Hyperlipidemia Father   . Nephrolithiasis Father   . Cancer Maternal Grandmother     liver  . Cancer Paternal Grandmother     bone  . Diabetes Paternal Grandfather     History   Social History  . Marital Status: Married    Spouse Name: N/A    Number of Children: N/A  . Years of Education: N/A   Social History Main Topics  . Smoking status: Never Smoker   . Smokeless tobacco: None  . Alcohol  Use: No  . Drug Use: No  . Sexual Activity:    Other Topics Concern  . None   Social History Narrative  . None   Review of Systems - see history of present illness. All other review of systems are negative.  Filed Vitals:   05/07/13 1140  BP: 120/90  Pulse: 79  Temp: 98.5 F (36.9 C)   Physical Exam  Vitals reviewed. Constitutional: She is oriented to person, place, and time and well-developed, well-nourished, and in no distress.  HENT:  Head: Normocephalic and atraumatic.  Right Ear: External ear normal.  Left Ear: External ear normal.  Nose: Nose normal.  Mouth/Throat: Oropharynx is clear and moist. No oropharyngeal exudate.  Tympanic membranes within normal limits bilaterally. No tenderness noted with percussion of sinuses.  Eyes: Conjunctivae are normal.  Neck: Neck supple.  Cardiovascular: Normal rate, regular rhythm, normal heart sounds and intact distal pulses.   Pulmonary/Chest: Effort normal. No respiratory distress. She has no wheezes. She has rales.  Lymphadenopathy:    She has no cervical adenopathy.  Neurological: She is alert and oriented to person, place, and time.  Skin: Skin is warm and dry. No rash noted.  Psychiatric: Affect normal.    Assessment/Plan: Acute bronchitis Chest x-ray. Rx azithromycin. Rx'd [employer]. Rest. Increase fluid intake. Probiotic. Saline nasal spray. Humidifier in bedroom.

## 2013-05-10 DIAGNOSIS — J209 Acute bronchitis, unspecified: Secondary | ICD-10-CM | POA: Insufficient documentation

## 2013-05-10 NOTE — Assessment & Plan Note (Signed)
Chest x-ray. Rx azithromycin. Rx'd [employer]. Rest. Increase fluid intake. Probiotic. Saline nasal spray. Humidifier in bedroom.

## 2013-09-02 ENCOUNTER — Ambulatory Visit (INDEPENDENT_AMBULATORY_CARE_PROVIDER_SITE_OTHER): Payer: BC Managed Care – PPO | Admitting: Family Medicine

## 2013-09-02 ENCOUNTER — Encounter: Payer: Self-pay | Admitting: Family Medicine

## 2013-09-02 VITALS — BP 118/72 | HR 77 | Temp 98.6°F | Wt 197.0 lb

## 2013-09-02 DIAGNOSIS — J329 Chronic sinusitis, unspecified: Secondary | ICD-10-CM

## 2013-09-02 MED ORDER — CEFUROXIME AXETIL 500 MG PO TABS: 500.0000 mg | ORAL_TABLET | Freq: Two times a day (BID) | ORAL | Status: AC

## 2013-09-02 NOTE — Progress Notes (Signed)
  Subjective:     Stephanie Stout is a 38 y.o. female who presents for evaluation of sinus pain. Symptoms include: congestion, cough, facial pain, headaches, nasal congestion and sinus pressure. Onset of symptoms was 12 days ago. Symptoms have been gradually worsening since that time. Past history is significant for no history of pneumonia or bronchitis. Patient is a non-smoker.  The following portions of the patient's history were reviewed and updated as appropriate: allergies, current medications, past family history, past medical history, past social history, past surgical history and problem list.  Review of Systems Pertinent items are noted in HPI.   Objective:    BP 118/72  Pulse 77  Temp(Src) 98.6 F (37 C) (Oral)  Wt 197 lb (89.359 kg)  SpO2 98% General appearance: alert, cooperative, appears stated age and no distress Ears: normal TM's and external ear canals both ears Nose: green discharge, moderate congestion, turbinates red, swollen, sinus tenderness bilateral Throat: lips, mucosa, and tongue normal; teeth and gums normal Neck: moderate anterior cervical adenopathy, supple, symmetrical, trachea midline and thyroid not enlarged, symmetric, no tenderness/mass/nodules Lungs: clear to auscultation bilaterally Heart: S1, S2 normal    Assessment:    Acute bacterial sinusitis.    Plan:    Nasal steroids per medication orders. Antihistamines per medication orders. Ceftin per medication orders.

## 2013-09-02 NOTE — Patient Instructions (Signed)

## 2013-09-02 NOTE — Progress Notes (Signed)
Pre visit review using our clinic review tool, if applicable. No additional management support is needed unless otherwise documented below in the visit note. 

## 2013-11-12 ENCOUNTER — Ambulatory Visit (INDEPENDENT_AMBULATORY_CARE_PROVIDER_SITE_OTHER): Payer: BC Managed Care – PPO | Admitting: *Deleted

## 2013-11-12 DIAGNOSIS — Z111 Encounter for screening for respiratory tuberculosis: Secondary | ICD-10-CM

## 2013-11-12 NOTE — Progress Notes (Signed)
   Subjective:    Patient ID: Stephanie Stout, female    DOB: 04/19/1976, 38 y.o.   MRN: [id number]  HPI    Review of Systems     Objective:   Physical Exam        Assessment & Plan:  Tuberculin skin test applied to Left ventral forearm. Informed pt not to rub and/scratch area, so as to not invalidate test; understood & agreed. she will come into office for reading on Fri, 06.19.15/SLS

## 2013-11-14 ENCOUNTER — Encounter: Payer: Self-pay | Admitting: *Deleted

## 2013-11-14 ENCOUNTER — Ambulatory Visit (INDEPENDENT_AMBULATORY_CARE_PROVIDER_SITE_OTHER): Payer: BC Managed Care – PPO | Admitting: Physician Assistant

## 2013-11-14 ENCOUNTER — Encounter: Payer: Self-pay | Admitting: Physician Assistant

## 2013-11-14 VITALS — BP 100/78 | HR 82 | Temp 98.4°F | Resp 16 | Ht 64.0 in | Wt 199.0 lb

## 2013-11-14 DIAGNOSIS — Z0000 Encounter for general adult medical examination without abnormal findings: Secondary | ICD-10-CM

## 2013-11-14 DIAGNOSIS — F411 Generalized anxiety disorder: Secondary | ICD-10-CM

## 2013-11-14 DIAGNOSIS — F419 Anxiety disorder, unspecified: Secondary | ICD-10-CM

## 2013-11-14 LAB — TB SKIN TEST
Induration: 0 mm
TB Skin Test: NEGATIVE

## 2013-11-14 MED ORDER — SERTRALINE HCL 25 MG PO TABS: 25.0000 mg | ORAL_TABLET | Freq: Every day | ORAL | Status: DC

## 2013-11-14 MED ORDER — SERTRALINE HCL 25 MG PO TABS: 25.0000 mg | ORAL_TABLET | Freq: Every day | ORAL | Status: AC

## 2013-11-14 NOTE — Progress Notes (Signed)
Pre visit review using our clinic review tool, if applicable. No additional management support is needed unless otherwise documented below in the visit note/SLS  

## 2013-11-22 DIAGNOSIS — Z0000 Encounter for general adult medical examination without abnormal findings: Secondary | ICD-10-CM | POA: Insufficient documentation

## 2013-11-22 NOTE — Progress Notes (Signed)
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  Patient without functional limitations impeding her ability to do her job.  Is moving to a new district.   Past Medical History  Diagnosis Date  . Allergy   . Infertility, female   . Nephrolithiasis   . Hematuria, microscopic     negative urologic w/u, CT of abd and pelvis was negative for malignancy  . Anxiety   . Acute pharyngitis 12/13/2012  . Abnormal blood level of iron 12/13/2012  . Other and unspecified hyperlipidemia 12/13/2012  . Other malaise and fatigue 12/13/2012    No current outpatient prescriptions on file prior to visit.   No current facility-administered medications on file prior to visit.    Allergies  Allergen Reactions  . Levofloxacin     REACTION: panic attacks, stomach upset  . Augmentin [Amoxicillin-Pot Clavulanate]     Upset stomach, diarrhea    Family History  Problem Relation Age of Onset  . Hypertension Mother   . Hyperlipidemia Mother   . Hypertension Father   . Hyperlipidemia Father   . Nephrolithiasis Father   . Cancer Maternal Grandmother     liver  . Cancer Paternal Grandmother     bone  . Diabetes Paternal Grandfather     History   Social History  . Marital Status: Married    Spouse Name: N/A    Number of Children: N/A  . Years of Education: N/A   Social History Main Topics  . Smoking status: Never Smoker   . Smokeless tobacco: None  . Alcohol Use: No  . Drug Use: No  . Sexual Activity:    Other Topics Concern  . None   Social History Narrative  . None   Review of Systems - See HPI.  All other ROS are negative.  BP 100/78  Pulse 82  Temp(Src) 98.4 F (36.9 C) (Oral)  Resp 16  Ht 5\' 4"  (1.626 m)  Wt 199 lb (90.266 kg)  BMI 34.14 kg/m2  SpO2 98%  LMP 11/09/2013  Physical Exam  Vitals reviewed. Constitutional: She is oriented to person, place, and time and well-developed, well-nourished, and in no distress.  HENT:  Head:  Normocephalic and atraumatic.  Right Ear: External ear normal.  Left Ear: External ear normal.  Nose: Nose normal.  Mouth/Throat: Oropharynx is clear and moist. No oropharyngeal exudate.  TM within normal limits bilaterally.   Eyes: Conjunctivae are normal. Pupils are equal, round, and reactive to light.  Neck: Neck supple.  Cardiovascular: Normal rate, regular rhythm, normal heart sounds and intact distal pulses.   Pulmonary/Chest: Effort normal and breath sounds normal. No respiratory distress. She has no wheezes. She has no rales. She exhibits no tenderness.  Abdominal: Soft. Bowel sounds are normal. She exhibits no distension and no mass. There is no tenderness. There is no rebound and no guarding.  Lymphadenopathy:    She has no cervical adenopathy.  Neurological: She is alert and oriented to person, place, and time.  Skin: Skin is warm and dry. No rash noted.  Psychiatric: Affect normal.    Recent Results (from the past 2160 hour(s))  TB SKIN TEST     Status: Normal   Collection Time    11/14/13  8:51 AM      Result Value Ref Range   TB Skin Test Negative     Induration 0.00      Assessment/Plan: No problem-specific assessment & plan notes found for this  encounter.

## 2013-11-22 NOTE — Assessment & Plan Note (Signed)
PPD negative.  Examination within normal limits.  Forms completed. Patient to carry immunization records with her when she turns forms in.

## 2014-04-26 IMAGING — CR DG CHEST 2V
2 series · 2 of 2 positions shown · non-contrast
Comparison: None.

CLINICAL DATA: Persistent cough after febrile illness.  Rule out
pneumonia.  Symptoms for 2 weeks.

CHEST - 2 VIEW

[view not recorded (1 of 2)]
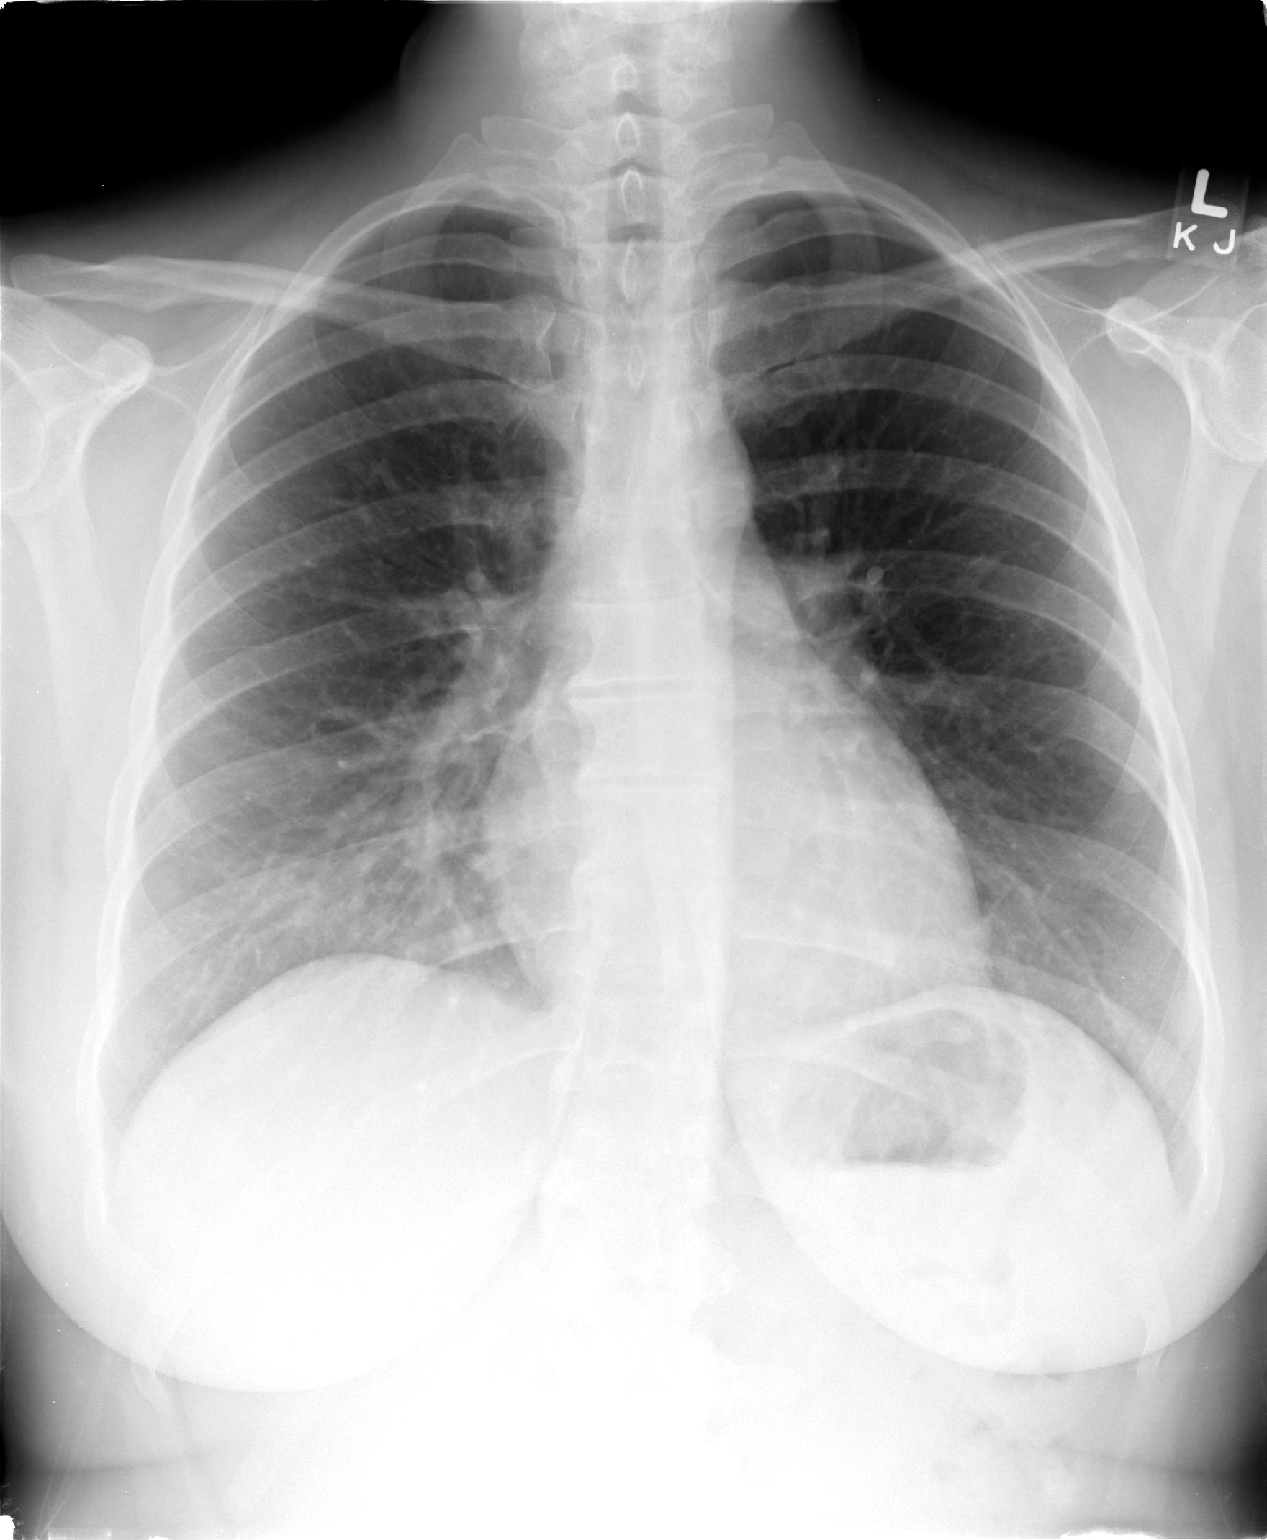

[view not recorded (2 of 2)]
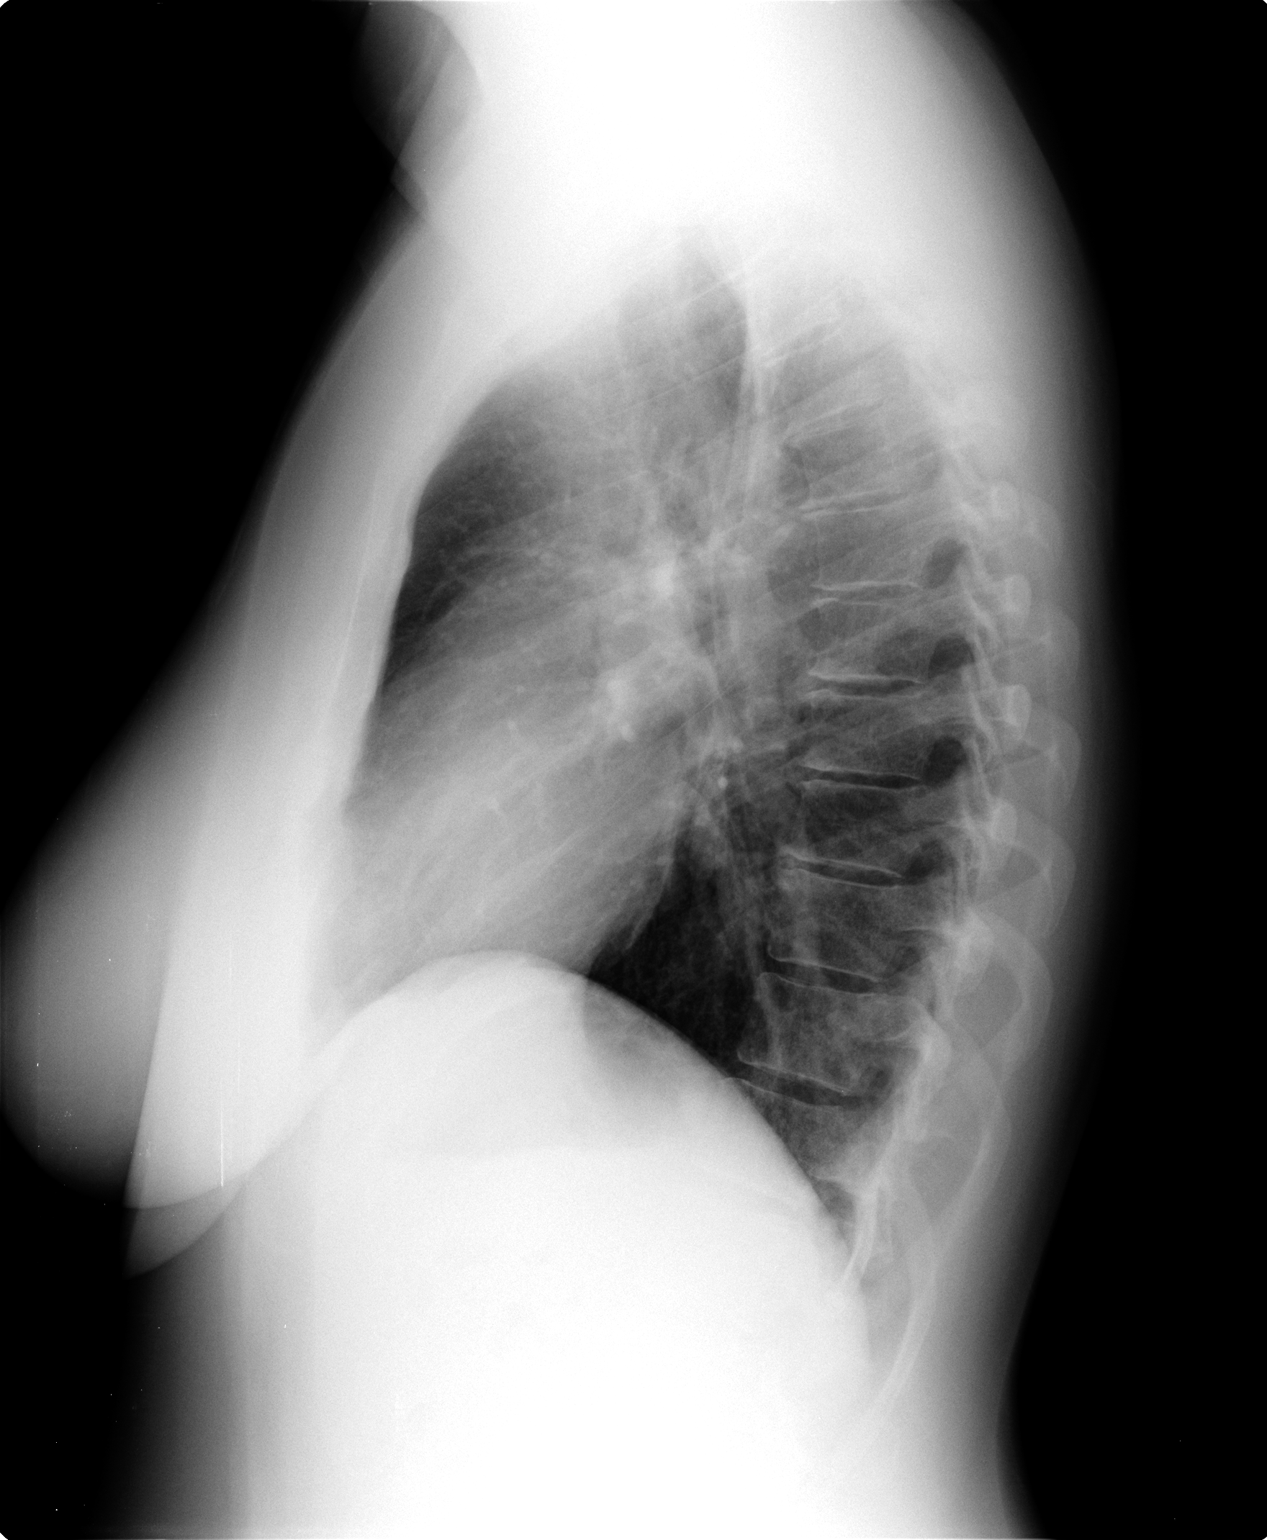

[2 of 2 positions shown; findings below may reference images not displayed]

FINDINGS: Midline trachea.  Normal heart size and mediastinal
contours. No pleural effusion or pneumothorax.  Clear lungs.
IMPRESSION: No acute cardiopulmonary disease.

## 2015-05-10 IMAGING — CR DG CHEST 2V
2 series · 2 of 2 positions shown · non-contrast
Comparison: PA and lateral chest 04/23/2012.

CLINICAL DATA: Rhonchi on physical examination.

EXAM:
CHEST  2 VIEW

[w chest pa]
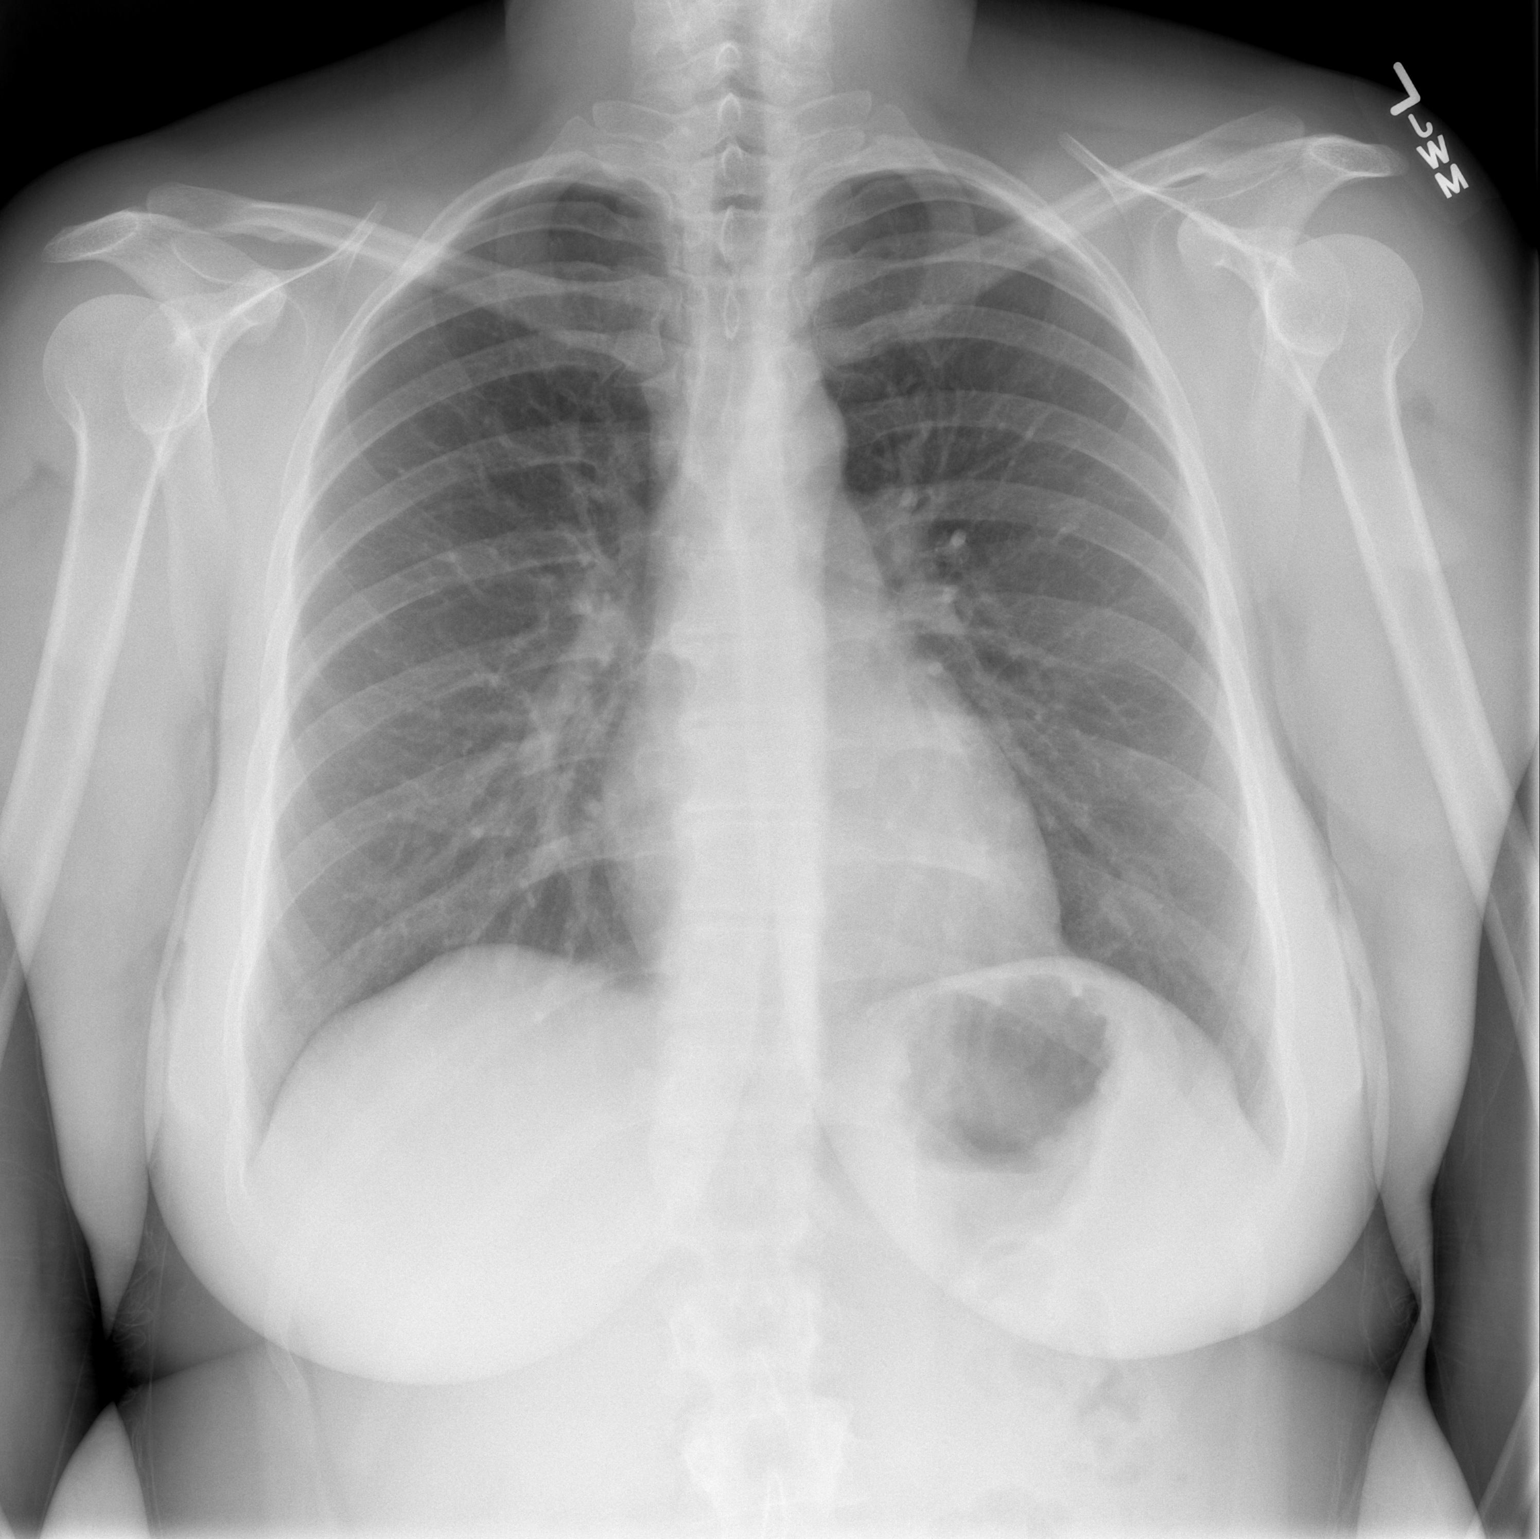

[w chest lat]
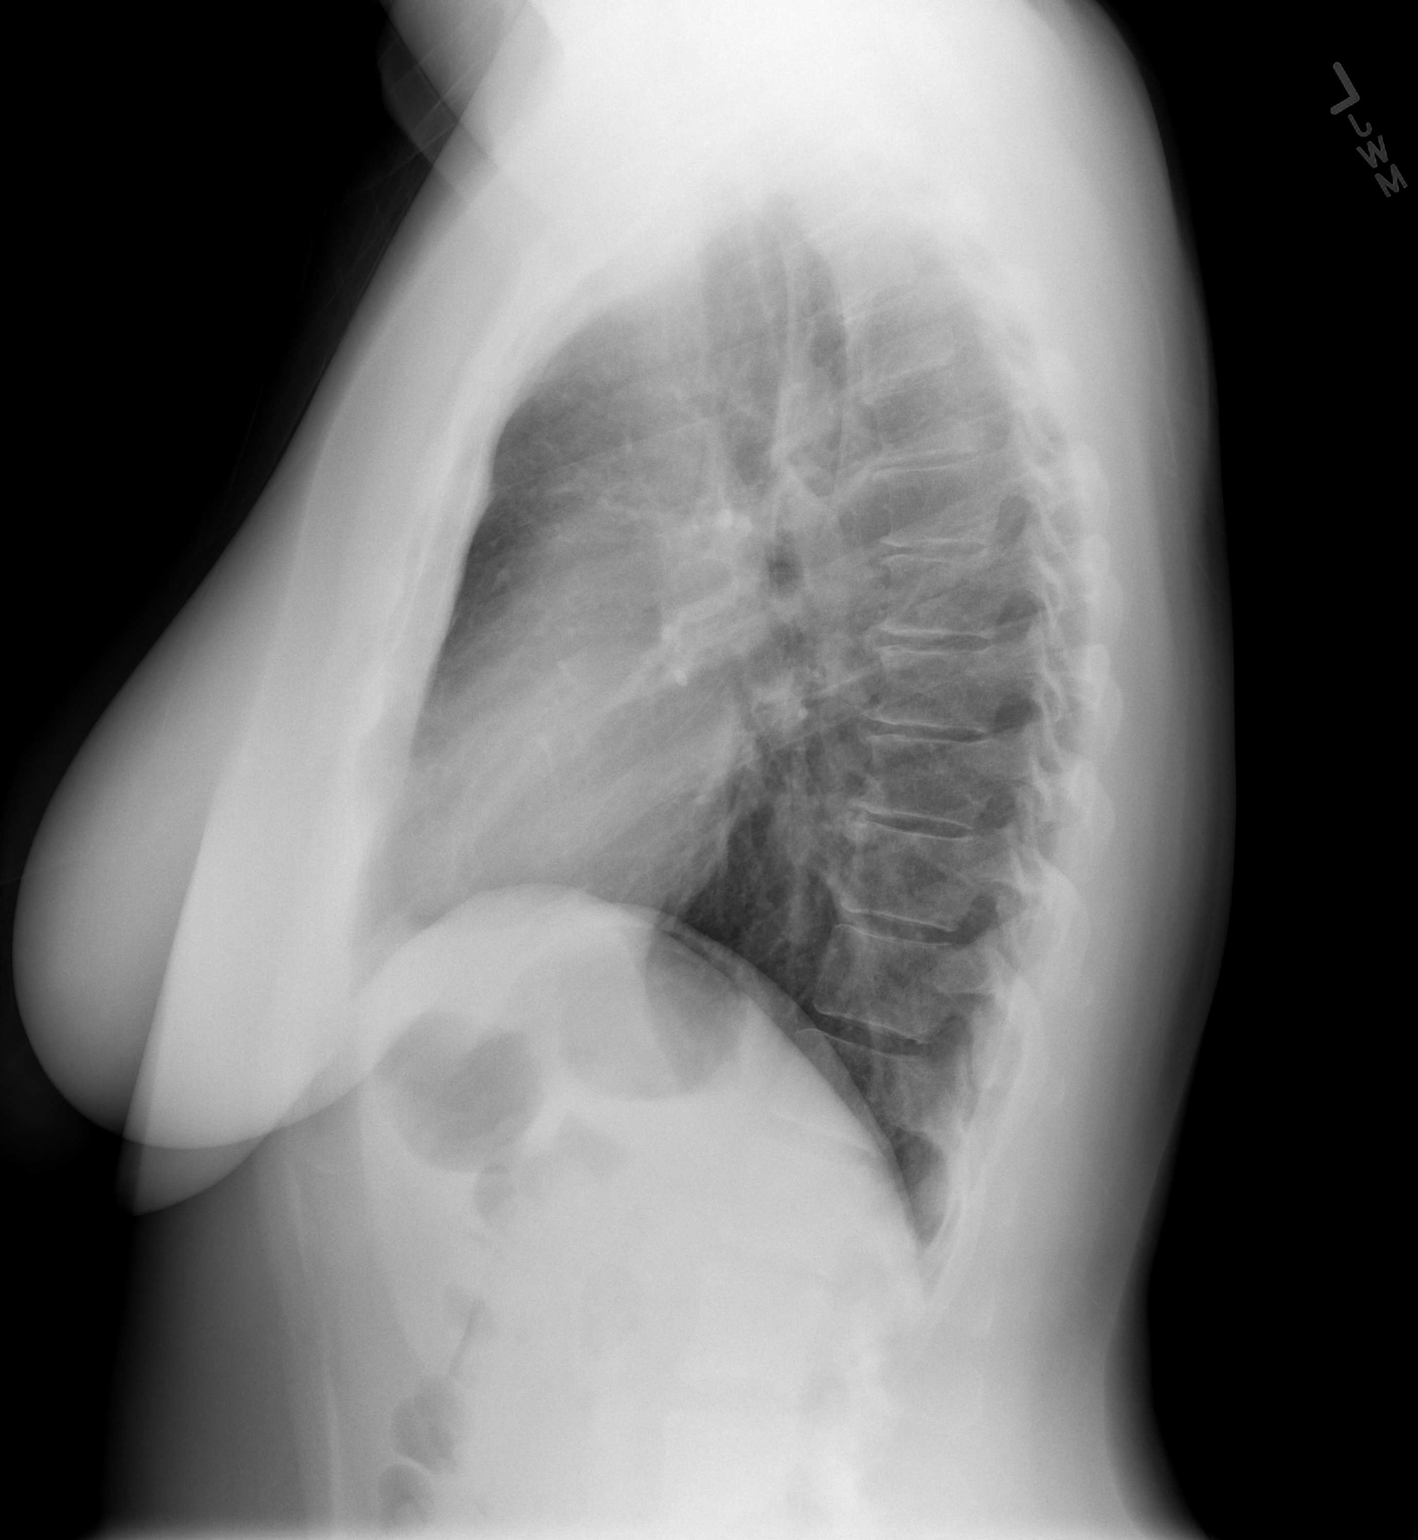

[2 of 2 positions shown; findings below may reference images not displayed]

FINDINGS: Heart size and mediastinal contours are within normal limits. Both
lungs are clear. Visualized skeletal structures are unremarkable.
IMPRESSION: Negative exam.
